# Patient Record
Sex: Female | Born: 1960 | Race: White | Hispanic: No | Marital: Married | Smoking: Never smoker
Health system: Southern US, Community
[De-identification: ages and names within clinical notes are randomized; demographics above are authoritative.]

## PROBLEM LIST (undated history)

## (undated) DIAGNOSIS — Z87442 Personal history of urinary calculi: Secondary | ICD-10-CM

## (undated) DIAGNOSIS — E039 Hypothyroidism, unspecified: Secondary | ICD-10-CM

## (undated) DIAGNOSIS — F419 Anxiety disorder, unspecified: Secondary | ICD-10-CM

## (undated) DIAGNOSIS — F32A Depression, unspecified: Secondary | ICD-10-CM

## (undated) DIAGNOSIS — K760 Fatty (change of) liver, not elsewhere classified: Secondary | ICD-10-CM

## (undated) DIAGNOSIS — F329 Major depressive disorder, single episode, unspecified: Secondary | ICD-10-CM

## (undated) DIAGNOSIS — E0500 Thyrotoxicosis with diffuse goiter without thyrotoxic crisis or storm: Secondary | ICD-10-CM

## (undated) HISTORY — DX: Major depressive disorder, single episode, unspecified: F32.9

## (undated) HISTORY — DX: Anxiety disorder, unspecified: F41.9

## (undated) HISTORY — PX: TUBAL LIGATION: SHX77

## (undated) HISTORY — DX: Thyrotoxicosis with diffuse goiter without thyrotoxic crisis or storm: E05.00

## (undated) HISTORY — DX: Depression, unspecified: F32.A

## (undated) HISTORY — PX: LITHOTRIPSY: SUR834

## (undated) HISTORY — DX: Hypothyroidism, unspecified: E03.9

## (undated) HISTORY — PX: BACK SURGERY: SHX140

---

## 1997-11-23 ENCOUNTER — Ambulatory Visit (HOSPITAL_COMMUNITY): Admission: RE | Admit: 1997-11-23 | Discharge: 1997-11-23 | Payer: Self-pay | Admitting: Family Medicine

## 1997-12-29 ENCOUNTER — Ambulatory Visit (HOSPITAL_COMMUNITY): Admission: RE | Admit: 1997-12-29 | Discharge: 1997-12-29 | Payer: Self-pay | Admitting: Family Medicine

## 1997-12-29 ENCOUNTER — Encounter: Payer: Self-pay | Admitting: Family Medicine

## 1998-06-15 ENCOUNTER — Other Ambulatory Visit: Admission: RE | Admit: 1998-06-15 | Discharge: 1998-06-15 | Payer: Self-pay | Admitting: Gynecology

## 1999-10-14 ENCOUNTER — Other Ambulatory Visit: Admission: RE | Admit: 1999-10-14 | Discharge: 1999-10-14 | Payer: Self-pay | Admitting: Gynecology

## 2001-01-24 ENCOUNTER — Encounter: Payer: Self-pay | Admitting: Urology

## 2001-01-24 ENCOUNTER — Ambulatory Visit (HOSPITAL_COMMUNITY): Admission: RE | Admit: 2001-01-24 | Discharge: 2001-01-24 | Payer: Self-pay | Admitting: Urology

## 2001-05-28 ENCOUNTER — Other Ambulatory Visit: Admission: RE | Admit: 2001-05-28 | Discharge: 2001-05-28 | Payer: Self-pay | Admitting: Gynecology

## 2002-06-19 ENCOUNTER — Other Ambulatory Visit: Admission: RE | Admit: 2002-06-19 | Discharge: 2002-06-19 | Payer: Self-pay | Admitting: Gynecology

## 2004-04-12 ENCOUNTER — Other Ambulatory Visit: Admission: RE | Admit: 2004-04-12 | Discharge: 2004-04-12 | Payer: Self-pay | Admitting: Gynecology

## 2005-04-19 ENCOUNTER — Other Ambulatory Visit: Admission: RE | Admit: 2005-04-19 | Discharge: 2005-04-19 | Payer: Self-pay | Admitting: Gynecology

## 2006-04-20 ENCOUNTER — Other Ambulatory Visit: Admission: RE | Admit: 2006-04-20 | Discharge: 2006-04-20 | Payer: Self-pay | Admitting: Gynecology

## 2007-07-15 ENCOUNTER — Other Ambulatory Visit: Admission: RE | Admit: 2007-07-15 | Discharge: 2007-07-15 | Payer: Self-pay | Admitting: Gynecology

## 2008-02-28 ENCOUNTER — Ambulatory Visit: Payer: Self-pay | Admitting: Obstetrics and Gynecology

## 2008-03-24 ENCOUNTER — Ambulatory Visit: Payer: Self-pay | Admitting: Gynecology

## 2008-05-28 ENCOUNTER — Ambulatory Visit: Payer: Self-pay | Admitting: Gynecology

## 2008-06-04 ENCOUNTER — Ambulatory Visit: Payer: Self-pay | Admitting: Gynecology

## 2008-06-05 ENCOUNTER — Encounter: Payer: Self-pay | Admitting: Gynecology

## 2008-06-05 ENCOUNTER — Ambulatory Visit: Payer: Self-pay | Admitting: Gynecology

## 2008-07-17 ENCOUNTER — Ambulatory Visit: Payer: Self-pay | Admitting: Gynecology

## 2008-09-15 ENCOUNTER — Encounter: Payer: Self-pay | Admitting: Gynecology

## 2008-09-15 ENCOUNTER — Ambulatory Visit: Payer: Self-pay | Admitting: Gynecology

## 2008-09-15 ENCOUNTER — Other Ambulatory Visit: Admission: RE | Admit: 2008-09-15 | Discharge: 2008-09-15 | Payer: Self-pay | Admitting: Gynecology

## 2009-02-01 ENCOUNTER — Ambulatory Visit: Payer: Self-pay | Admitting: Gynecology

## 2009-11-18 ENCOUNTER — Ambulatory Visit: Payer: Self-pay | Admitting: Gynecology

## 2009-11-18 ENCOUNTER — Other Ambulatory Visit: Admission: RE | Admit: 2009-11-18 | Discharge: 2009-11-18 | Payer: Self-pay | Admitting: Gynecology

## 2010-12-08 ENCOUNTER — Encounter: Payer: Self-pay | Admitting: *Deleted

## 2010-12-09 ENCOUNTER — Encounter: Payer: Self-pay | Admitting: Gynecology

## 2010-12-09 ENCOUNTER — Other Ambulatory Visit (HOSPITAL_COMMUNITY)
Admission: RE | Admit: 2010-12-09 | Discharge: 2010-12-09 | Disposition: A | Discharge status: Home / Self Care | Payer: 59 | Source: Ambulatory Visit | Attending: Gynecology | Admitting: Gynecology

## 2010-12-09 ENCOUNTER — Ambulatory Visit (INDEPENDENT_AMBULATORY_CARE_PROVIDER_SITE_OTHER): Payer: 59 | Admitting: Gynecology

## 2010-12-09 VITALS — BP 112/64 | Ht 65.0 in | Wt 200.0 lb

## 2010-12-09 DIAGNOSIS — Z131 Encounter for screening for diabetes mellitus: Secondary | ICD-10-CM

## 2010-12-09 DIAGNOSIS — Z01419 Encounter for gynecological examination (general) (routine) without abnormal findings: Secondary | ICD-10-CM

## 2010-12-09 DIAGNOSIS — R823 Hemoglobinuria: Secondary | ICD-10-CM

## 2010-12-09 DIAGNOSIS — E039 Hypothyroidism, unspecified: Secondary | ICD-10-CM

## 2010-12-09 DIAGNOSIS — N920 Excessive and frequent menstruation with regular cycle: Secondary | ICD-10-CM

## 2010-12-09 DIAGNOSIS — Z13220 Encounter for screening for lipoid disorders: Secondary | ICD-10-CM

## 2010-12-09 NOTE — Progress Notes (Signed)
Whitney Huynh [birth year] [id number]   History:    50 y.o.  for annual exam.  Continues to have heavy menses and wants to proceed with endometrial ablation. We've evaluated her before several years ago she had a negative sonohysterogram although her endometrial biopsy was inadequate she was supposed tohave this repeated she never did that.  Her periods last 7 days double protection with 2 episodes significant cramping.  Past medical history,surgical history, family history and social history were all reviewed and documented in the EPIC chart. ROS:  Was performed and pertinent positives and negatives are included in the history.  Exam: chaperone present Filed Vitals:   12/09/10 1043  BP: 112/64   General appearance  Normal Skin grossly normal Head/Neck normal with no cervical or supraclavicular adenopathy thyroid normal Lungs  clear Cardiac RR, without RMG Abdominal  soft, nontender, without masses, guarding, rebound or organomegaly  Breasts  examined lying and sitting without masses, retractions, discharge or axillary adenopathy. Pelvic  Ext/BUS/vagina  normal   Cervix  normal Pap done  Uterus  retroverted, normal size, shape and contour, midline and mobile nontender   Adnexa  Without masses or tenderness  Anus and perineum  normal   Rectovaginal  normal sphincter tone without palpated masses or tenderness.   Assessment/Plan:  50 y.o. female for annual exam .    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  Endometrial sac sampling was in adequate. Recommend a repeat sonohysterogram now with resampling as his been 2 and half years.  She wants to proceed with endometrial ablation we've discussed this before and we'll plan scheduling her for this. #2 Health maintenance: self breast exams was on a monthly basis discussed encouraged.  She is scheduling mammogram now. We'll check baseline labs  of CBC lipid profile glucose urinalysis and a TSH given her hypothyroid history and menorrhagia.   Dara Lords MD, 11:18 AM 12/09/2010

## 2010-12-13 ENCOUNTER — Telehealth: Payer: Self-pay | Admitting: *Deleted

## 2010-12-13 NOTE — Telephone Encounter (Signed)
PT CALLED WANTING TSH RESULT. LM ON PT VM THAT TSH STILL PENDING.

## 2010-12-14 ENCOUNTER — Telehealth: Payer: Self-pay | Admitting: *Deleted

## 2010-12-14 NOTE — Progress Notes (Signed)
Addended by: Dara Lords on: 12/14/2010 02:56 PM   Modules accepted: Orders

## 2010-12-14 NOTE — Telephone Encounter (Signed)
ok 

## 2010-12-14 NOTE — Telephone Encounter (Signed)
SPOKE WITH PT AND SHE SAID THAT SHE WILL GO TO HER ENDOCRINOLOGIST DR. BALAN TO GET THE FULL THYROID PANEL DRAWN. SHE WOULD LIKE ME TO FAX RESULTS OVER TO [facility]. PT WANTED ME TO INFORM YOU OF THIS.

## 2010-12-14 NOTE — Telephone Encounter (Signed)
Message copied by Aura Camps on Wed Dec 14, 2010  3:24 PM ------      Message from: Dara Lords      Created: Wed Dec 14, 2010  2:49 PM       Tell patient TSH is low may be consistent with hyper thyroid.  I want her to repeat a full thyroid panel we'll do this at [facility].

## 2011-01-05 DIAGNOSIS — F32A Depression, unspecified: Secondary | ICD-10-CM | POA: Insufficient documentation

## 2011-01-05 DIAGNOSIS — E0500 Thyrotoxicosis with diffuse goiter without thyrotoxic crisis or storm: Secondary | ICD-10-CM | POA: Insufficient documentation

## 2011-01-05 DIAGNOSIS — F329 Major depressive disorder, single episode, unspecified: Secondary | ICD-10-CM | POA: Insufficient documentation

## 2011-01-05 DIAGNOSIS — F419 Anxiety disorder, unspecified: Secondary | ICD-10-CM | POA: Insufficient documentation

## 2011-01-13 ENCOUNTER — Other Ambulatory Visit: Payer: 59

## 2011-01-13 ENCOUNTER — Ambulatory Visit: Payer: 59 | Admitting: Gynecology

## 2011-09-21 ENCOUNTER — Encounter: Payer: Self-pay | Admitting: Gynecology

## 2013-02-03 ENCOUNTER — Ambulatory Visit (INDEPENDENT_AMBULATORY_CARE_PROVIDER_SITE_OTHER): Payer: 59 | Admitting: Gynecology

## 2013-02-03 ENCOUNTER — Encounter: Payer: Self-pay | Admitting: Gynecology

## 2013-02-03 DIAGNOSIS — N926 Irregular menstruation, unspecified: Secondary | ICD-10-CM

## 2013-02-03 NOTE — Patient Instructions (Signed)
Follow up for ultrasound as scheduled 

## 2013-02-03 NOTE — Progress Notes (Signed)
Patient presents having not been seen for 2 years complaining of irregular menses. Notes that she will bleed on off and has been bleeding continuously now for months. She is on a low-dose oral contraceptive by her primary physician for several months now to try regulate her bleeding. Is being treated for hypothyroidism and said that she was checked 3 months ago and was normal. No significant weight changes, hair or skin changes. She is status post BTL in the past.  Exam was Kym Asst. Abdomen soft nontender without masses guarding rebound organomegaly. Pelvic external BUS vagina normal with dark blood staining. Cervix normal. Uterus retroverted globoid generous in size. Adnexa without masses or tenderness.   Assessment and plan : Irregular bleeding on BCPs. Check baseline CBC FSH TSH prolactin. Start with sonohysterogram to rule out nonpalpable abnormalities.

## 2013-02-04 LAB — CBC WITH DIFFERENTIAL/PLATELET
Eosinophils Absolute: 0.1 10*3/uL (ref 0.0–0.7)
Eosinophils Relative: 1 % (ref 0–5)
HCT: 38.6 % (ref 36.0–46.0)
Hemoglobin: 12.8 g/dL (ref 12.0–15.0)
Lymphocytes Relative: 31 % (ref 12–46)
Lymphs Abs: 1.8 10*3/uL (ref 0.7–4.0)
MCH: 30.5 pg (ref 26.0–34.0)
MCV: 92.1 fL (ref 78.0–100.0)
Monocytes Relative: 10 % (ref 3–12)
Platelets: 448 10*3/uL — ABNORMAL HIGH (ref 150–400)
RBC: 4.19 MIL/uL (ref 3.87–5.11)
WBC: 5.7 10*3/uL (ref 4.0–10.5)

## 2013-02-04 LAB — FOLLICLE STIMULATING HORMONE: FSH: 12.4 m[IU]/mL

## 2013-02-14 ENCOUNTER — Other Ambulatory Visit: Payer: Self-pay | Admitting: Gynecology

## 2013-02-14 ENCOUNTER — Encounter: Payer: Self-pay | Admitting: Gynecology

## 2013-02-14 DIAGNOSIS — N926 Irregular menstruation, unspecified: Secondary | ICD-10-CM

## 2013-02-21 ENCOUNTER — Ambulatory Visit (INDEPENDENT_AMBULATORY_CARE_PROVIDER_SITE_OTHER): Payer: 59

## 2013-02-21 ENCOUNTER — Telehealth: Payer: Self-pay

## 2013-02-21 ENCOUNTER — Encounter: Payer: Self-pay | Admitting: Gynecology

## 2013-02-21 ENCOUNTER — Ambulatory Visit (INDEPENDENT_AMBULATORY_CARE_PROVIDER_SITE_OTHER): Payer: 59 | Admitting: Gynecology

## 2013-02-21 DIAGNOSIS — N926 Irregular menstruation, unspecified: Secondary | ICD-10-CM

## 2013-02-21 DIAGNOSIS — N946 Dysmenorrhea, unspecified: Secondary | ICD-10-CM

## 2013-02-21 DIAGNOSIS — IMO0002 Reserved for concepts with insufficient information to code with codable children: Secondary | ICD-10-CM

## 2013-02-21 DIAGNOSIS — D259 Leiomyoma of uterus, unspecified: Secondary | ICD-10-CM

## 2013-02-21 NOTE — Patient Instructions (Signed)
Office will call you to arrange surgery. 

## 2013-02-21 NOTE — Progress Notes (Signed)
Patient presents for sonohistogram with months of irregular bleeding. Was placed on oral contraceptives but continued to have bleeding. Laboratory testing to include FSH TSH prolactin were normal.  Ultrasound shows normal uterine size. 2 small myomas largest 17 mm . The endometrial echo 4.7 mm. Right and left ovaries normal. Cul-de-sac negative.  Sonohysterogram attempted under sterile technique and due to the severe retroflexion retroversion I was unable to negotiate into the endometrial cavity and there was some distention with no abnormality seen but suboptimal. No sample taken.  Assessment and plan: Irregular bleeding 52 year old female refractile to birth control pills with suboptimal sonohysterogram. Options for management reviewed and ultimately we decided to proceed with hysteroscopy D&C to clear the endometrial cavity and possible therapeutic effect of the D&C. I reviewed with involved with the procedure to include the expected intraoperative postoperative courses as well as the recovery period. I reviewed the use of the hysteroscope and D&C portion. Risks of infection, hemorrhage necessitating transfusion and the risk of transfusion including transfusion reaction hepatitis HIV mad cow disease and other unknown entities. Uterine perforation with damage to internal organs including bowel bladder ureters vessels and nerves necessitating major exploratory reparative surgeries and future repair and surgeries including ostomy formation bowel resection and bladder repair ureteral damage repair was all discussed with her. Distended media Bjrk and leading to metabolic complications such as coma and seizures was also discussed. No guarantees as far resolving the irregular bleeding also discussed. We will go ahead and schedule this at her convenience.  I went ahead and reperformed a full exam in the event surgery is scheduled soon to include: HEENT normal Lungs clear Cardiac regular rate no rubs murmurs  or gallops Abdomen soft nontender without masses or guarding Pelvic external BUS vagina normal. Cervix anterior grossly normal. Uterus significantly retroflexed normal size midline mobile. Adnexa without masses.

## 2013-02-21 NOTE — Telephone Encounter (Signed)
I called patient to schedule surgery and first discussed her insurance benefits with her $1000 deductible not yet met 80/20.  Her surgery prepymt will be $500 to [facility].  She told me that she is not working right now and she is going to have to put this off and not schedule now.  She said she will call me to schedule when she gets to a place where she can afford to do it.

## 2013-03-04 ENCOUNTER — Encounter: Payer: Self-pay | Admitting: Gynecology

## 2013-03-04 ENCOUNTER — Ambulatory Visit (INDEPENDENT_AMBULATORY_CARE_PROVIDER_SITE_OTHER): Payer: 59 | Admitting: Gynecology

## 2013-03-04 VITALS — BP 118/74 | Ht 65.0 in | Wt 239.0 lb

## 2013-03-04 DIAGNOSIS — Z01419 Encounter for gynecological examination (general) (routine) without abnormal findings: Secondary | ICD-10-CM

## 2013-03-04 NOTE — Progress Notes (Signed)
Whitney Huynh February 17, 1961 [id number]        52 y.o.  G3P3003 for annual exam.  Several issues noted below.  Past medical history,surgical history, medications, allergies, family history and social history were all reviewed and documented in the EPIC chart.  ROS:  Performed and pertinent positives and negatives are included in the history, assessment and plan .  Exam: Kim assistant Filed Vitals:   03/04/13 1444  BP: 118/74  Height: 5\' 5"  (1.651 m)  Weight: 239 lb (108.41 kg)   General appearance  Normal Skin grossly normal Head/Neck normal with no cervical or supraclavicular adenopathy thyroid normal Lungs  clear Cardiac RR, without RMG Abdominal  soft, nontender, without masses, organomegaly or hernia Breasts  examined lying and sitting without masses, retractions, discharge or axillary adenopathy. Pelvic  Ext/BUS/vagina  normal  Cervix  normal anterior  Uterus  retroverted, normal size, shape and contour, midline and mobile nontender   Adnexa  Without masses or tenderness    Anus and perineum  normal   Rectovaginal  normal sphincter tone without palpated masses or tenderness.    Assessment/Plan:  52 y.o. G47P3003 female for annual exam.   1. History of irregular menses. Recent sonohysterogram suboptimal in no endometrial sample taken. CBC FSH prolactin TSH were normal. Was to schedule hysteroscopy D&C for endometrial clearance but has canceled this due to financial reasons. I reviewed with her I cannot guarantee there is not endometrial pathology and my recommendation is that she proceed with endometrial sampling. The patient understands our recommendation and disclaimers but does not want to schedule at this time accepting the risks. Patient will keep menstrual calendar and if irregular bleeding continues she'll represent for further evaluation again recognizes my recommendation to proceed with hysteroscopy D&C at this point. 2. Pap smear 2012. No Pap smear done today. No history of  abnormal Pap smears previously. Repeat Pap smear next year at 3 year interval. 3. Mammography overdue. Patient knows to schedule and agrees to do so. SBE monthly reviewed. 4. Colonoscopy never. Recommended she scheduled this coming year as she has turned 52. Patient agrees to do so. 5. DEXA never. We'll plan further into the menopause. Increase calcium vitamin D reviewed. 6. Health maintenance. Baseline comprehensive metabolic panel lipid profile and urinalysis ordered. She has already had recent CBC and thyroid with her other hormone studies.  Note: This document was prepared with digital dictation and possible smart phrase technology. Any transcriptional errors that result from this process are unintentional.   Dara Lords MD, 3:26 PM 03/04/2013

## 2013-03-04 NOTE — Patient Instructions (Addendum)
Schedule colonoscopy with [facility] at [phone] or [facility] at [phone]  Call to Schedule your mammogram  Facilities in [place]: 1)  [facility], [address] Phone: [phone] 2)  [facility], [address] Phone: [phone] 3)  Dr. Bertrand at [facility]  [address] Phone: [phone]     Mammogram A mammogram is an X-ray test to find changes in a woman's breast. You should get a mammogram if:  You are 40 years of age or older  You have risk factors.   Your doctor recommends that you have one.  BEFORE THE TEST  Do not schedule the test the week before your period, especially if your breasts are sore during this time.  On the day of your mammogram:  Wash your breasts and armpits well. After washing, do not put on any deodorant or talcum powder on until after your test.   Eat and drink as you usually do.   Take your medicines as usual.   If you are diabetic and take insulin, make sure you:   Eat before coming for your test.   Take your insulin as usual.   If you cannot keep your appointment, call before the appointment to cancel. Schedule another appointment.  TEST  You will need to undress from the waist up. You will put on a hospital gown.   Your breast will be put on the mammogram machine, and it will press firmly on your breast with a piece of plastic called a compression paddle. This will make your breast flatter so that the machine can X-ray all parts of your breast.   Both breasts will be X-rayed. Each breast will be X-rayed from above and from the side. An X-ray might need to be taken again if the picture is not good enough.   The mammogram will last about 15 to 30 minutes.  AFTER THE TEST Finding out the results of your test Ask when your test results will be ready. Make sure you get your test  results.  Document Released: 07/21/2008 Document Revised: 04/13/2011 Document Reviewed: 07/21/2008 [employer] Patient Information 2012 [employer].   

## 2013-03-05 LAB — LIPID PANEL
Cholesterol: 150 mg/dL (ref 0–200)
Total CHOL/HDL Ratio: 3.6 Ratio
Triglycerides: 180 mg/dL — ABNORMAL HIGH (ref <150)
VLDL: 36 mg/dL (ref 0–40)

## 2013-03-05 LAB — COMPREHENSIVE METABOLIC PANEL
ALT: 19 U/L (ref 0–35)
Alkaline Phosphatase: 50 U/L (ref 39–117)
CO2: 22 mEq/L (ref 19–32)
Calcium: 9.4 mg/dL (ref 8.4–10.5)
Chloride: 105 mEq/L (ref 96–112)
Creat: 0.93 mg/dL (ref 0.50–1.10)
Glucose, Bld: 86 mg/dL (ref 70–99)
Total Bilirubin: 0.2 mg/dL — ABNORMAL LOW (ref 0.3–1.2)
Total Protein: 6.9 g/dL (ref 6.0–8.3)

## 2013-03-05 LAB — URINALYSIS W MICROSCOPIC + REFLEX CULTURE
Bilirubin Urine: NEGATIVE
Crystals: NONE SEEN
Ketones, ur: NEGATIVE mg/dL
Protein, ur: NEGATIVE mg/dL
Specific Gravity, Urine: 1.022 (ref 1.005–1.030)
Urobilinogen, UA: 0.2 mg/dL (ref 0.0–1.0)

## 2013-03-11 ENCOUNTER — Encounter: Payer: Self-pay | Admitting: Gynecology

## 2013-03-13 ENCOUNTER — Other Ambulatory Visit: Payer: Self-pay

## 2014-03-09 ENCOUNTER — Encounter: Payer: Self-pay | Admitting: Gynecology

## 2018-02-18 ENCOUNTER — Ambulatory Visit: Payer: Self-pay | Admitting: Physician Assistant

## 2018-02-19 ENCOUNTER — Encounter: Payer: Self-pay | Admitting: Gynecology

## 2018-02-19 ENCOUNTER — Ambulatory Visit: Payer: 59 | Admitting: Gynecology

## 2018-02-19 VITALS — BP 118/78 | Ht 65.0 in | Wt 261.0 lb

## 2018-02-19 DIAGNOSIS — Z01419 Encounter for gynecological examination (general) (routine) without abnormal findings: Secondary | ICD-10-CM

## 2018-02-19 DIAGNOSIS — N951 Menopausal and female climacteric states: Secondary | ICD-10-CM | POA: Diagnosis not present

## 2018-02-19 NOTE — Progress Notes (Signed)
    Whitney Huynh Dec 09, 1960 [id number]        57 y.o.  G3P3003 for annual gynecologic exam.  Has not been in the office since 2014.  Has been seeing another practice.  Last reported Pap smear 2018 which was normal by her history.  Without gynecologic complaints.  She is having some mild menopausal symptoms.  No bleeding since 2014.  Past medical history,surgical history, problem list, medications, allergies, family history and social history were all reviewed and documented as reviewed in the EPIC chart.  ROS:  Performed with pertinent positives and negatives included in the history, assessment and plan.   Additional significant findings : None   Exam: Caryn Bee assistant Vitals:   02/19/18 1410  BP: 118/78  Weight: 261 lb (118.4 kg)  Height: 5\' 5"  (1.651 m)   Body mass index is 43.43 kg/m.  General appearance:  Normal affect, orientation and appearance. Skin: Grossly normal HEENT: Without gross lesions.  No cervical or supraclavicular adenopathy. Thyroid normal.  Lungs:  Clear without wheezing, rales or rhonchi Cardiac: RR, without RMG Abdominal:  Soft, nontender, without masses, guarding, rebound, organomegaly or hernia Breasts:  Examined lying and sitting without masses, retractions, discharge or axillary adenopathy. Pelvic:  Ext, BUS, Vagina: Normal with mild atrophic changes  Cervix: Normal with mild atrophic changes  Uterus: Difficult to palpate but grossly normal midline mobile nontender  Adnexa: Without gross masses or tenderness    Anus and perineum: Normal   Rectovaginal: Normal sphincter tone without palpated masses or tenderness.    Assessment/Plan:  57 y.o. G28P3003 female for annual gynecologic exam.   1. Postmenopausal/menopausal symptoms.  Having some mild symptoms attributable to menopause.  We reviewed the issues of HRT, risks versus benefits.  At this point symptoms do not appear to warrant treatment with HRT.  Will monitor and follow-up if symptoms would  worsen and wants to do rediscuss HRT.  No vaginal bleeding. 2. Pap smear reported normal 2018.  No Pap smear done today.  No history of abnormal Pap smears.  Plan repeat Pap smear at 3-year interval per current screening guidelines. 3. Colonoscopy never.  Has discussed colon screening with her primary physician and and is planning on Cologuard.  She will continue to follow-up with her primary physician for colon screening. 4. Mammography due now and patient is going to call and schedule.  Names and numbers provided.  Breast exam normal today. 5. DEXA never.  Will plan further into the menopause. 6. Health maintenance.  No routine lab work done as patient does this at her primary physician's office.  Follow-up 1 year, sooner as needed.   Anastasio Auerbach MD, 2:35 PM 02/19/2018

## 2018-02-19 NOTE — Patient Instructions (Signed)
Follow-up in 1 year for your annual exam.   Call to Schedule your mammogram  Facilities in [place]: 1)  [facility] [address] [place] Phone: [phone] 2)  Dr. Isaiah Blakes at [facility] [address] Phone: [phone]     Mammogram A mammogram is an X-ray test to find changes in a woman's breast. You should get a mammogram if:  You are 55 years of age or older  You have risk factors.   Your doctor recommends that you have one.  BEFORE THE TEST  Do not schedule the test the week before your period, especially if your breasts are sore during this time.  On the day of your mammogram:  Wash your breasts and armpits well. After washing, do not put on any deodorant or talcum powder on until after your test.   Eat and drink as you usually do.   Take your medicines as usual.   If you are diabetic and take insulin, make sure you:   Eat before coming for your test.   Take your insulin as usual.   If you cannot keep your appointment, call before the appointment to cancel. Schedule another appointment.  TEST  You will need to undress from the waist up. You will put on a hospital gown.   Your breast will be put on the mammogram machine, and it will press firmly on your breast with a piece of plastic called a compression paddle. This will make your breast flatter so that the machine can X-ray all parts of your breast.   Both breasts will be X-rayed. Each breast will be X-rayed from above and from the side. An X-ray might need to be taken again if the picture is not good enough.   The mammogram will last about 15 to 30 minutes.  AFTER THE TEST Finding out the results of your test Ask when your test results will be ready. Make sure you get your test results.  Document Released: 07/21/2008 Document Revised: 04/13/2011 Document Reviewed: 07/21/2008 [facility] Patient Information 2012 [place].

## 2018-03-14 ENCOUNTER — Other Ambulatory Visit: Payer: Self-pay | Admitting: Gynecology

## 2018-03-14 DIAGNOSIS — Z1231 Encounter for screening mammogram for malignant neoplasm of breast: Secondary | ICD-10-CM

## 2018-03-21 ENCOUNTER — Encounter: Payer: Self-pay | Admitting: Physician Assistant

## 2018-03-21 ENCOUNTER — Ambulatory Visit: Payer: 59 | Admitting: Physician Assistant

## 2018-03-21 DIAGNOSIS — G4700 Insomnia, unspecified: Secondary | ICD-10-CM

## 2018-03-21 DIAGNOSIS — F411 Generalized anxiety disorder: Secondary | ICD-10-CM | POA: Diagnosis not present

## 2018-03-21 DIAGNOSIS — F331 Major depressive disorder, recurrent, moderate: Secondary | ICD-10-CM

## 2018-03-21 NOTE — Patient Instructions (Signed)
Instead of taking 2 Abilify, take 1-1/2 to equal 3 mg.

## 2018-03-21 NOTE — Progress Notes (Signed)
[facility]  Patient ID: Whitney Huynh,  MRN: [id number]  PCP: Patient, No Pcp Per  Date of Evaluation: 03/21/2018 Time spent:15 minutes  Chief Complaint:  Chief Complaint    Follow-up      HISTORY/CURRENT STATUS: HPI here for 6-week med check.  At the last visit we increased the Abilify to help with motivation and energy.  It has really helped a lot, with that as well as her mood.  The only problem she is having is more ruminating thoughts.  They are worse in the evening and are usually triggered by something.  When she takes an Ativan, it helps.  Patient denies loss of interest in usual activities and is able to enjoy things.  Denies decreased energy or motivation.  Appetite has not changed.  No extreme sadness, tearfulness, or feelings of hopelessness.  Denies any changes in concentration, making decisions or remembering things.  Denies suicidal or homicidal thoughts.  She is sleeping well.  Occasionally, she wakes up around 3:00 but has no problems going back to sleep.  Work is going well.  Individual Medical History/ Review of Systems: Changes? :No    Past medications for mental health diagnoses include: Zoloft, Prozac, Lexapro, Wellbutrin,, Ativan, Abilify  Allergies: Morphine and related  Current Medications:  Current Outpatient Medications:  .  buPROPion (WELLBUTRIN XL) 300 MG 24 hr tablet, Take 300 mg by mouth daily., Disp: , Rfl:  .  ARIPiprazole (ABILIFY) 2 MG tablet, Take 4 mg by mouth daily. , Disp: , Rfl:  .  levothyroxine (SYNTHROID) 150 MCG tablet, Take 175 mcg by mouth daily. , Disp: , Rfl:  .  LORazepam (ATIVAN) 1 MG tablet, Take 0.5 mg by mouth 2 (two) times daily as needed. , Disp: , Rfl:  .  Vilazodone HCl (VIIBRYD) 40 MG TABS, Take by mouth daily.  , Disp: , Rfl:  Medication Side Effects: none  Family Medical/ Social History: Changes? No  MENTAL HEALTH EXAM:  Last menstrual period 02/17/2013.There is no height or weight on file to  calculate BMI.  General Appearance: Well Groomed and Obese  Eye Contact:  Good  Speech:  Clear and Coherent  Volume:  Normal  Mood:  Euthymic  Affect:  Appropriate  Thought Process:  Goal Directed  Orientation:  Full (Time, Place, and Person)  Thought Content: Logical   Suicidal Thoughts:  No  Homicidal Thoughts:  No  Memory:  WNL  Judgement:  Good  Insight:  Good  Psychomotor Activity:  Normal  Concentration:  Concentration: Good  Recall:  Good  Fund of Knowledge: Good  Language: Good  Assets:  Desire for Improvement  ADL's:  Intact  Cognition: WNL  Prognosis:  Good    DIAGNOSES:    ICD-10-CM   1. Major depressive disorder, recurrent episode, moderate (HCC) F33.1   2. Insomnia, unspecified type G47.00   3. Generalized anxiety disorder F41.1     Receiving Psychotherapy: No    RECOMMENDATIONS: Because the ruminations started around the same time that we increased the Abilify that can be more activating, will decrease the Abilify from 4 mg to 3 mg.  The Abilify tablets are white small so she may end up using the 2 mg in biting and another one in half but that will be fine.  If the motivation and energy decreased then she can go back up to 4 mg.  She verbalizes understanding. Continue all other medications. Return in 3 months or sooner as needed.   Donnal Moat, PA-C

## 2018-04-24 ENCOUNTER — Ambulatory Visit
Admission: RE | Admit: 2018-04-24 | Discharge: 2018-04-24 | Disposition: A | Discharge status: Home / Self Care | Payer: 59 | Source: Ambulatory Visit | Attending: Gynecology | Admitting: Gynecology

## 2018-04-24 DIAGNOSIS — Z1231 Encounter for screening mammogram for malignant neoplasm of breast: Secondary | ICD-10-CM

## 2018-04-24 IMAGING — MG DIGITAL SCREENING BILATERAL MAMMOGRAM WITH TOMO AND CAD
8 series · 8 of 24 positions shown · non-contrast
Comparison: Previous exam(s).

CLINICAL DATA: Screening.

EXAM:
DIGITAL SCREENING BILATERAL MAMMOGRAM WITH TOMO AND CAD

[L MLO synth-2D]
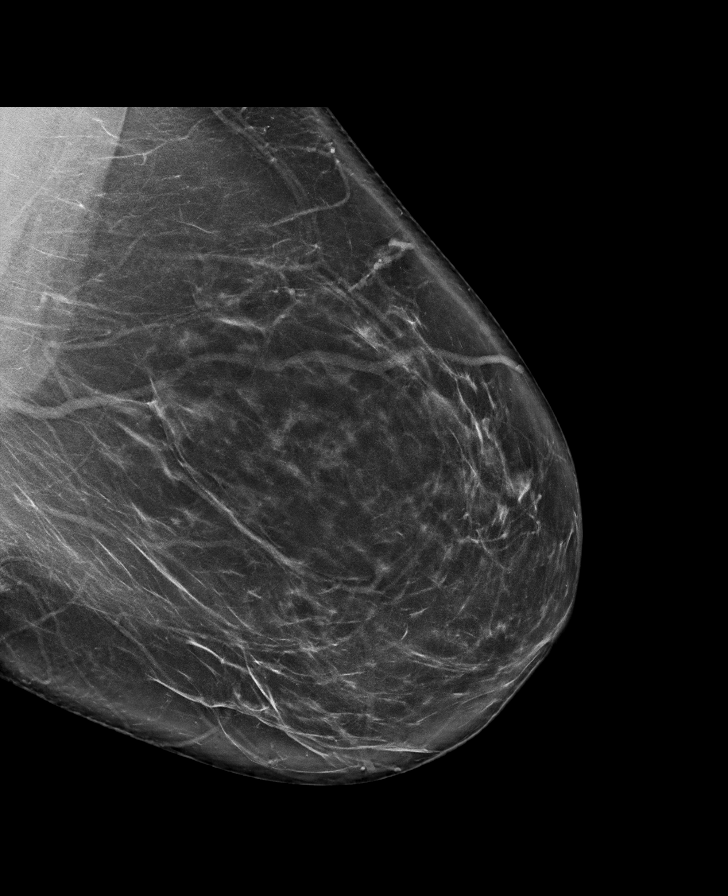

[L CC synth-2D]
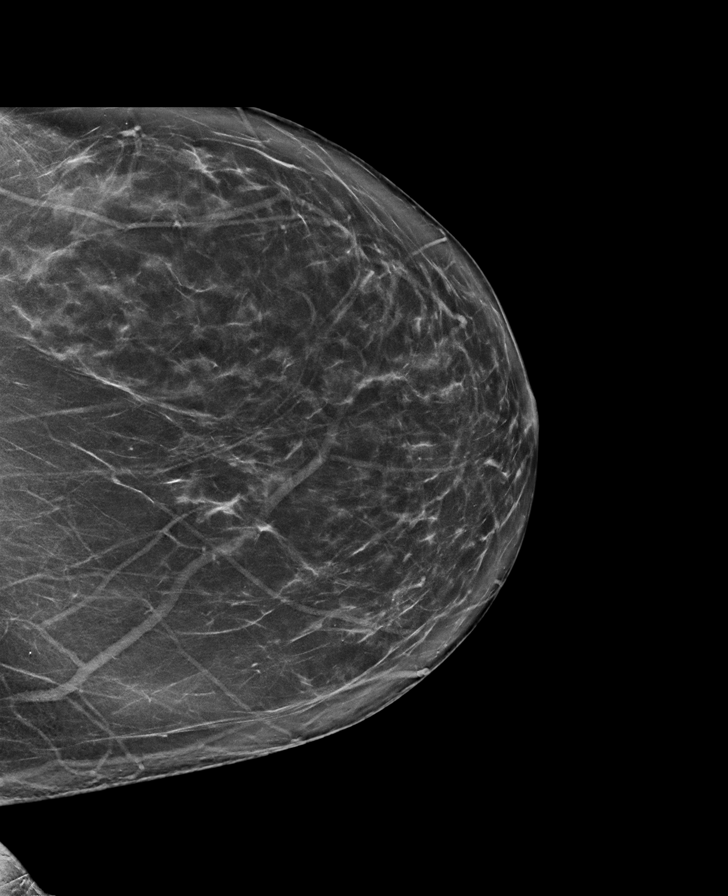

[R CC synth-2D]
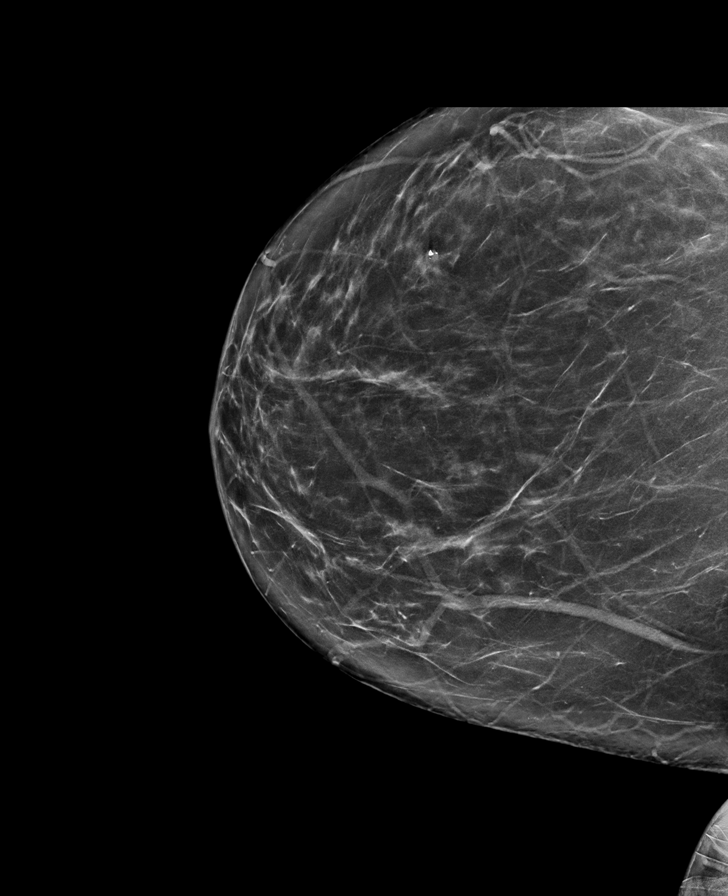

[R MLO synth-2D]
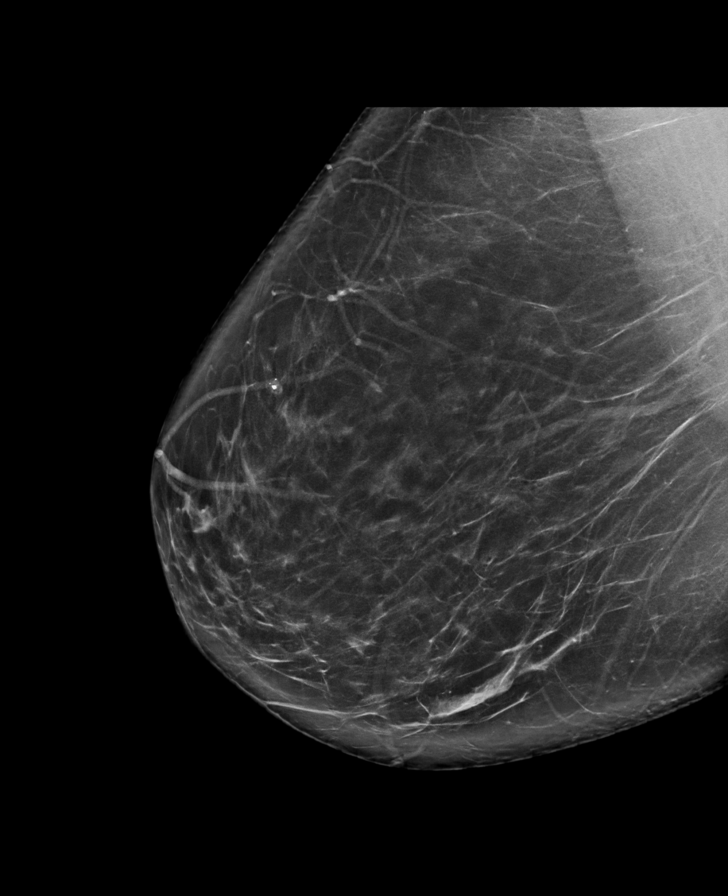

[L MLO tomo · tomo slice 47/94.0]
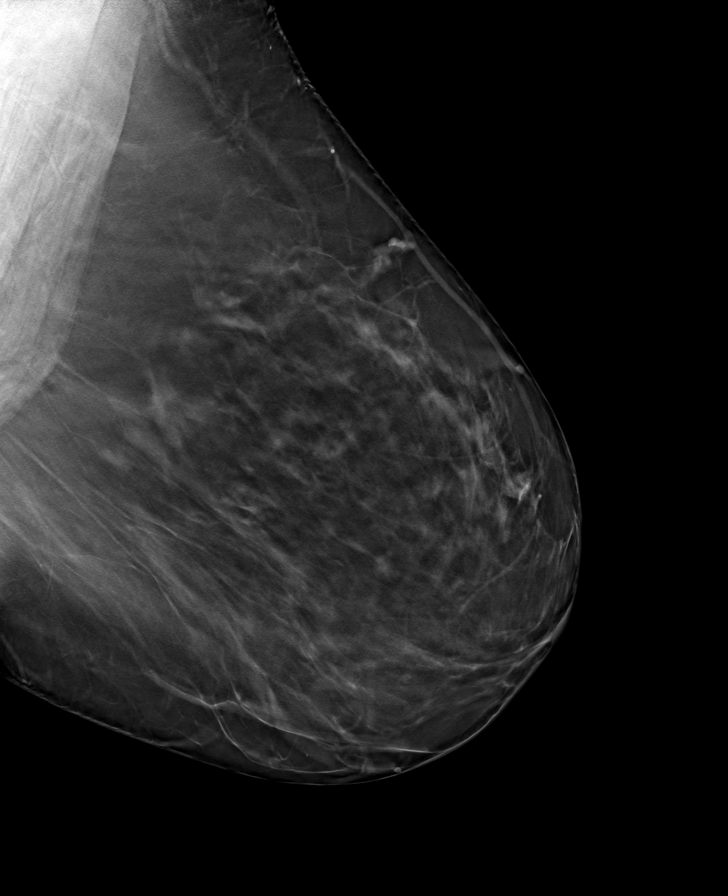

[R MLO tomo · tomo slice 48/95.0]
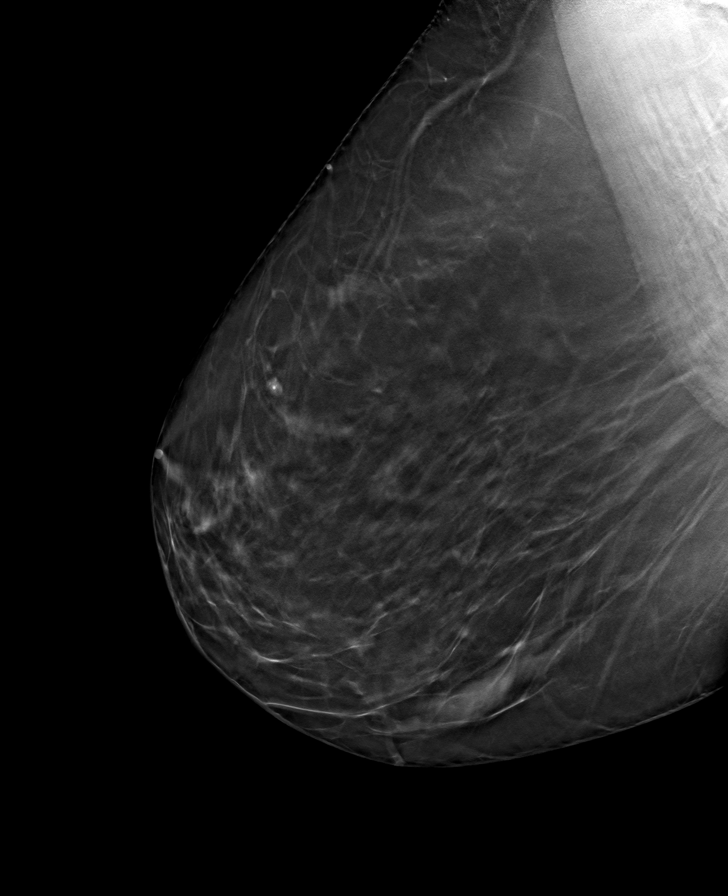

[R CC tomo · tomo slice 41/81.0]
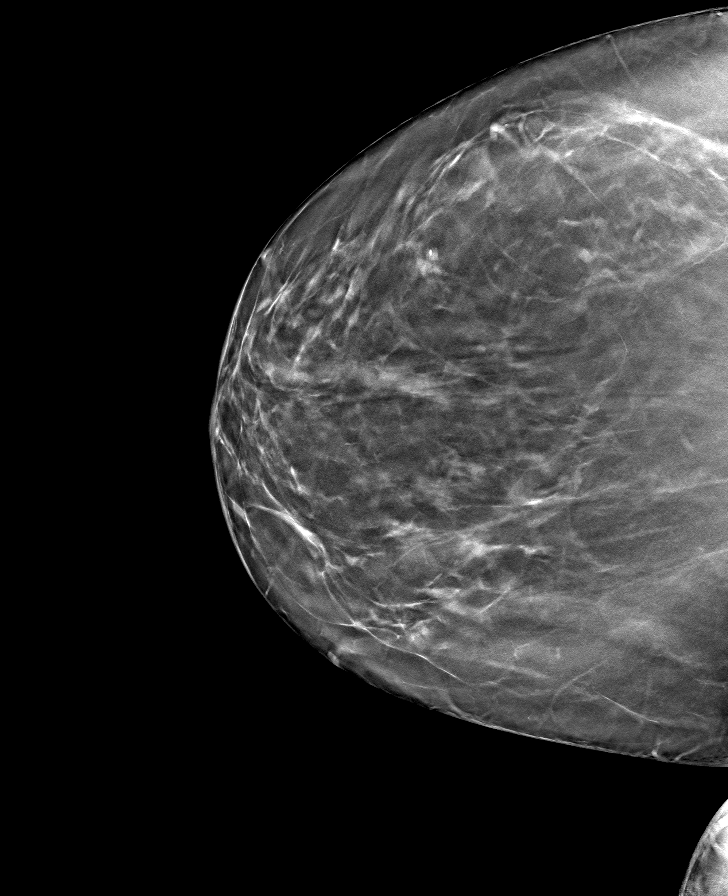

[L CC tomo · tomo slice 41/80.0]
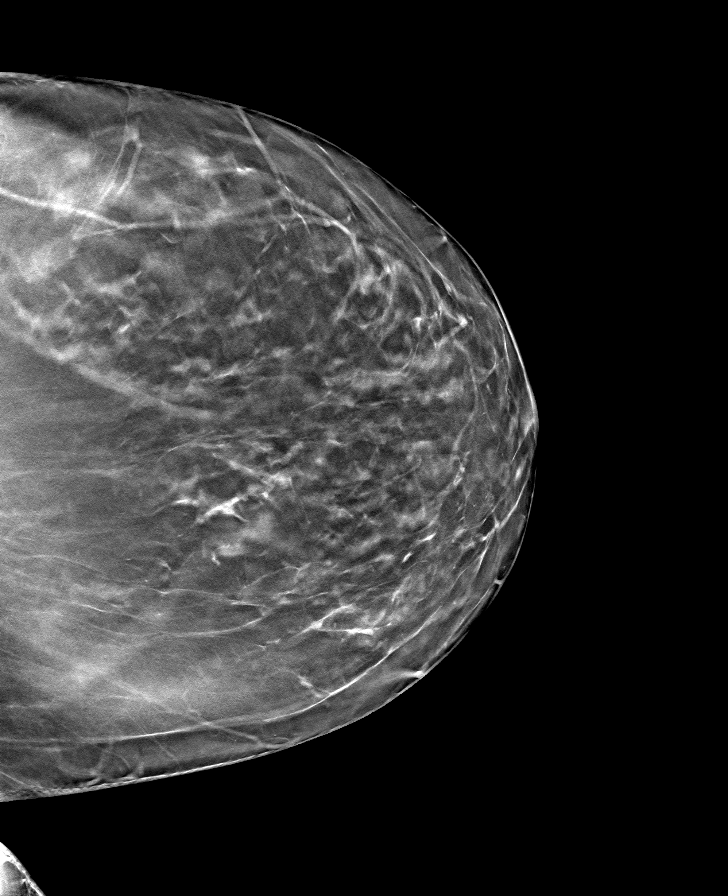

[8 of 24 positions shown; findings below may reference images not displayed]

ACR Breast Density Category b: There are scattered areas of
fibroglandular density.
FINDINGS: There are no findings suspicious for malignancy. Images were
processed with CAD.
IMPRESSION: No mammographic evidence of malignancy. A result letter of this
screening mammogram will be mailed directly to the patient.

RECOMMENDATION:
Screening mammogram in one year. (Code:[TQ])

BI-RADS CATEGORY  1: Negative.

## 2018-05-22 ENCOUNTER — Other Ambulatory Visit: Payer: Self-pay | Admitting: Physician Assistant

## 2018-06-11 ENCOUNTER — Encounter: Payer: Self-pay | Admitting: Emergency Medicine

## 2018-06-11 DIAGNOSIS — G4700 Insomnia, unspecified: Secondary | ICD-10-CM

## 2018-06-11 DIAGNOSIS — R635 Abnormal weight gain: Secondary | ICD-10-CM | POA: Insufficient documentation

## 2018-06-18 ENCOUNTER — Ambulatory Visit: Payer: 59 | Admitting: Physician Assistant

## 2018-06-18 ENCOUNTER — Encounter: Payer: Self-pay | Admitting: Physician Assistant

## 2018-06-18 DIAGNOSIS — F331 Major depressive disorder, recurrent, moderate: Secondary | ICD-10-CM

## 2018-06-18 DIAGNOSIS — F411 Generalized anxiety disorder: Secondary | ICD-10-CM

## 2018-06-18 DIAGNOSIS — G4700 Insomnia, unspecified: Secondary | ICD-10-CM

## 2018-06-18 MED ORDER — ARIPIPRAZOLE 2 MG PO TABS: 3.0000 mg | ORAL_TABLET | Freq: Every day | ORAL | 5 refills | Status: DC

## 2018-06-18 MED ORDER — BUPROPION HCL ER (XL) 300 MG PO TB24: 300.0000 mg | ORAL_TABLET | Freq: Every day | ORAL | 5 refills | Status: DC

## 2018-06-18 MED ORDER — VILAZODONE HCL 40 MG PO TABS: 40.0000 mg | ORAL_TABLET | Freq: Every day | ORAL | 5 refills | Status: DC

## 2018-06-18 MED ORDER — LORAZEPAM 1 MG PO TABS: 0.5000 mg | ORAL_TABLET | Freq: Two times a day (BID) | ORAL | 5 refills | Status: DC | PRN

## 2018-06-18 NOTE — Progress Notes (Signed)
[facility]  Patient ID: Whitney Huynh,  MRN: [id number]  PCP: Patient, No Pcp Per  Date of Evaluation: 06/18/2018 Time spent:15 minutes  Chief Complaint:  Chief Complaint    Follow-up      HISTORY/CURRENT STATUS: HPI Here for routine med check.  Patient denies loss of interest in usual activities and is able to enjoy things.  Denies decreased energy or motivation.  Appetite has not changed.  No extreme sadness, tearfulness, or feelings of hopelessness.  Denies any changes in concentration, making decisions or remembering things.  Denies suicidal or homicidal thoughts.  Anxiety is well controlled overall.  She sleeps well as long as she takes the Ativan.  Individual Medical History/ Review of Systems: Changes? :No    Past medications for mental health diagnoses include: Zoloft, Prozac, Lexapro, Wellbutrin, Viibryd, Cymbalta, Ativan, Abilify  Allergies: Morphine and related  Current Medications:  Current Outpatient Medications:  .  ARIPiprazole (ABILIFY) 2 MG tablet, Take 1.5 tablets (3 mg total) by mouth daily., Disp: 60 tablet, Rfl: 5 .  buPROPion (WELLBUTRIN XL) 300 MG 24 hr tablet, Take 1 tablet (300 mg total) by mouth daily., Disp: 30 tablet, Rfl: 5 .  levothyroxine (SYNTHROID) 150 MCG tablet, Take 175 mcg by mouth daily. , Disp: , Rfl:  .  LORazepam (ATIVAN) 1 MG tablet, Take 0.5-1 tablets (0.5-1 mg total) by mouth 2 (two) times daily as needed., Disp: 60 tablet, Rfl: 5 .  Vilazodone HCl (VIIBRYD) 40 MG TABS, Take 1 tablet (40 mg total) by mouth daily., Disp: 30 tablet, Rfl: 5 Medication Side Effects: none  Family Medical/ Social History: Changes? Yes dtr's boyfriend broke up w/ her and it's been hard on the whole fm.  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  MENTAL HEALTH EXAM:   Last menstrual period 02/17/2013.There is no height or weight on file to calculate BMI.  General Appearance: Casual and Well Groomed  Eye Contact:  Good  Speech:   Clear and Coherent  Volume:  Normal  Mood:  Euthymic  Affect:  Appropriate  Thought Process:  Goal Directed  Orientation:  Full (Time, Place, and Person)  Thought Content: Logical   Suicidal Thoughts:  No  Homicidal Thoughts:  No  Memory:  WNL  Judgement:  Good  Insight:  Good  Psychomotor Activity:  Normal  Concentration:  Concentration: Good  Recall:  Good  Fund of Knowledge: Good  Language: Good  Assets:  Desire for Improvement  ADL's:  Intact  Cognition: WNL  Prognosis:  Good    DIAGNOSES:    ICD-10-CM   1. Major depressive disorder, recurrent episode, moderate (HCC) F33.1   2. Insomnia, unspecified type G47.00   3. Generalized anxiety disorder F41.1     Receiving Psychotherapy: No    RECOMMENDATION: Continue Abilify 3 mg daily. Continue Wellbutrin XL 300 mg OD. Continue Ativan 1 mg half to 1 twice daily as needed. Continue Viibryd 40 mg daily. Return in 6 months or sooner as needed.   Donnal Moat, PA-C

## 2018-06-21 ENCOUNTER — Other Ambulatory Visit: Payer: Self-pay | Admitting: Physician Assistant

## 2018-12-16 ENCOUNTER — Other Ambulatory Visit: Payer: Self-pay | Admitting: Physician Assistant

## 2018-12-17 ENCOUNTER — Other Ambulatory Visit: Payer: Self-pay

## 2018-12-17 ENCOUNTER — Ambulatory Visit (INDEPENDENT_AMBULATORY_CARE_PROVIDER_SITE_OTHER): Payer: 59 | Admitting: Physician Assistant

## 2018-12-17 ENCOUNTER — Encounter: Payer: Self-pay | Admitting: Physician Assistant

## 2018-12-17 DIAGNOSIS — G4700 Insomnia, unspecified: Secondary | ICD-10-CM

## 2018-12-17 DIAGNOSIS — F331 Major depressive disorder, recurrent, moderate: Secondary | ICD-10-CM | POA: Diagnosis not present

## 2018-12-17 DIAGNOSIS — F411 Generalized anxiety disorder: Secondary | ICD-10-CM | POA: Diagnosis not present

## 2018-12-17 MED ORDER — BUPROPION HCL ER (XL) 300 MG PO TB24: 300.0000 mg | ORAL_TABLET | Freq: Every day | ORAL | 5 refills | Status: DC

## 2018-12-17 MED ORDER — ARIPIPRAZOLE 2 MG PO TABS: 3.0000 mg | ORAL_TABLET | Freq: Once | ORAL | 5 refills | Status: DC

## 2018-12-17 MED ORDER — LORAZEPAM 1 MG PO TABS: 0.5000 mg | ORAL_TABLET | Freq: Three times a day (TID) | ORAL | 5 refills | Status: DC | PRN

## 2018-12-17 MED ORDER — VIIBRYD 40 MG PO TABS: 40.0000 mg | ORAL_TABLET | Freq: Every day | ORAL | 5 refills | Status: DC

## 2018-12-17 NOTE — Progress Notes (Signed)
[facility]  Patient ID: Whitney Huynh,  MRN: [id number]  PCP: Patient, No Pcp Per  Date of Evaluation: 12/17/2018 Time spent:15 minutes  Chief Complaint:  Chief Complaint    Depression; Anxiety; Follow-up     Virtual Visit via Telephone Note  I connected with patient by a video enabled telemedicine application or telephone, with their informed consent, and verified patient privacy and that I am speaking with the correct person using two identifiers.  I am private, in my home and the patient is home.   I discussed the limitations, risks, security and privacy concerns of performing an evaluation and management service by telephone and the availability of in person appointments. I also discussed with the patient that there may be a patient responsible charge related to this service. The patient expressed understanding and agreed to proceed.   I discussed the assessment and treatment plan with the patient. The patient was provided an opportunity to ask questions and all were answered. The patient agreed with the plan and demonstrated an understanding of the instructions.   The patient was advised to call back or seek an in-person evaluation if the symptoms worsen or if the condition fails to improve as anticipated.  I provided 15 minutes of non-face-to-face time during this encounter.  HISTORY/CURRENT STATUS: HPI For routine med check.  Her Dad has been dx w/ terminal bladder cancer.  He only has a few months to live.  Mom's dementia is getting worse.  Patient states she is overwhelmed but is making it through.  Her biggest problem right now is trying to get long-term care to go into her parents house.  Apparently there is a shortage of assistance in that area.  Patient is continuing to work.  States that her employer has been extremely understanding due to all the circumstances.  Patient states she sleeps pretty well.  The anxiety has worsened with her situation.  The  Ativan does not seem to be working as well and it does not last as long.  She wonders what we can do to help.  She does not want to be "numb"  through this whole situation, but she does need help with the anxiety so that she can function.  She is of course very sad due to her parents illnesses.  Energy and motivation are fair to good.  Denies suicidal or homicidal thoughts.  She does cry easily but knows it is related to grief.  Denies dizziness, syncope, seizures, numbness, tingling, tremor, tics, unsteady gait, slurred speech, confusion. Denies muscle or joint pain, stiffness, or dystonia.  Individual Medical History/ Review of Systems: Changes? :No    Past medications for mental health diagnoses include: Zoloft, Prozac, Lexapro, Wellbutrin, Viibryd, Cymbalta, Ativan, Abilify  Allergies: Morphine and related  Current Medications:  Current Outpatient Medications:  .  ARIPiprazole (ABILIFY) 2 MG tablet, Take 1.5 tablets (3 mg total) by mouth once for 1 dose., Disp: 45 tablet, Rfl: 5 .  buPROPion (WELLBUTRIN XL) 300 MG 24 hr tablet, Take 1 tablet (300 mg total) by mouth daily., Disp: 30 tablet, Rfl: 5 .  levothyroxine (SYNTHROID) 150 MCG tablet, Take 175 mcg by mouth daily. , Disp: , Rfl:  .  LORazepam (ATIVAN) 1 MG tablet, Take 0.5-1 tablets (0.5-1 mg total) by mouth every 8 (eight) hours as needed., Disp: 90 tablet, Rfl: 5 .  Vilazodone HCl (VIIBRYD) 40 MG TABS, Take 1 tablet (40 mg total) by mouth daily., Disp: 30 tablet, Rfl: 5 Medication Side Effects:  none  Family Medical/ Social History: Changes? Yes see above  MENTAL HEALTH EXAM:  Last menstrual period 02/17/2013.There is no height or weight on file to calculate BMI.  General Appearance: Unable to assess  Eye Contact:  Unable to assess  Speech:  Clear and Coherent  Volume:  Normal  Mood:  Sad  Affect:  Unable to assess  Thought Process:  Goal Directed  Orientation:  Full (Time, Place, and Person)  Thought Content: Logical    Suicidal Thoughts:  No  Homicidal Thoughts:  No  Memory:  WNL  Judgement:  Good  Insight:  Good  Psychomotor Activity:  Unable to assess  Concentration:  Concentration: Good  Recall:  Good  Fund of Knowledge: Good  Language: Good  Assets:  Desire for Improvement  ADL's:  Intact  Cognition: WNL  Prognosis:  Good    DIAGNOSES:    ICD-10-CM   1. Major depressive disorder, recurrent episode, moderate (HCC)  F33.1   2. Generalized anxiety disorder  F41.1   3. Insomnia, unspecified type  G47.00     Receiving Psychotherapy: No    RECOMMENDATIONS:  I am sorry to hear about her father's terminal diagnosis and the fact that her mom's dementia is worsening. Continue Abilify 3 mg every morning. Continue Wellbutrin XL 300 mg daily. Continue Viibryd 40 mg daily. Increase Ativan 1 mg, 1/2-1 every 8 hours as needed.  PDMP reviewed Recommend therapy. FMLA for 8 days/month for parent's appts. (If she needs it in future.) Return in 6 months.  Donnal Moat, PA-C   This record has been created using [employer].  Chart creation errors have been sought, but may not always have been located and corrected. Such creation errors do not reflect on the standard of medical care.

## 2019-02-04 ENCOUNTER — Encounter: Payer: Self-pay | Admitting: Gynecology

## 2019-04-25 ENCOUNTER — Other Ambulatory Visit: Payer: 59

## 2019-05-16 ENCOUNTER — Other Ambulatory Visit: Payer: Self-pay | Admitting: Physician Assistant

## 2019-06-24 ENCOUNTER — Other Ambulatory Visit: Payer: Self-pay | Admitting: Physician Assistant

## 2019-06-24 NOTE — Telephone Encounter (Signed)
Last apt 12/2018 due back this month

## 2019-07-11 ENCOUNTER — Other Ambulatory Visit: Payer: Self-pay | Admitting: Physician Assistant

## 2019-07-28 ENCOUNTER — Other Ambulatory Visit: Payer: Self-pay | Admitting: Physician Assistant

## 2019-07-29 NOTE — Telephone Encounter (Signed)
Last apt 12/2018,nothing scheduled

## 2019-08-01 ENCOUNTER — Ambulatory Visit: Payer: 59 | Attending: Internal Medicine

## 2019-08-01 DIAGNOSIS — Z23 Encounter for immunization: Secondary | ICD-10-CM

## 2019-08-01 NOTE — Progress Notes (Signed)
   Covid-19 Vaccination Clinic  Name:  Whitney Huynh    MRN: [id number] DOB: 12-21-1960  08/01/2019  [name] was observed post Covid-19 immunization for 15 minutes without incident. She was provided with Vaccine Information Sheet and instruction to access the V-Safe system.   [name] was instructed to call 911 with any severe reactions post vaccine: Marland Kitchen Difficulty breathing  . Swelling of face and throat  . A fast heartbeat  . A bad rash all over body  . Dizziness and weakness   Immunizations Administered    Name Date Dose VIS Date Route   Moderna COVID-19 Vaccine 08/01/2019 12:39 PM 0.5 mL 04/08/2019 Intramuscular   Manufacturer: Moderna   Lot: [id number]   [id number]

## 2019-08-13 ENCOUNTER — Other Ambulatory Visit: Payer: Self-pay | Admitting: Physician Assistant

## 2019-08-19 ENCOUNTER — Ambulatory Visit (INDEPENDENT_AMBULATORY_CARE_PROVIDER_SITE_OTHER): Payer: 59 | Admitting: Physician Assistant

## 2019-08-19 ENCOUNTER — Encounter: Payer: Self-pay | Admitting: Physician Assistant

## 2019-08-19 ENCOUNTER — Other Ambulatory Visit: Payer: Self-pay

## 2019-08-19 DIAGNOSIS — F411 Generalized anxiety disorder: Secondary | ICD-10-CM | POA: Diagnosis not present

## 2019-08-19 DIAGNOSIS — F331 Major depressive disorder, recurrent, moderate: Secondary | ICD-10-CM

## 2019-08-19 DIAGNOSIS — G4700 Insomnia, unspecified: Secondary | ICD-10-CM | POA: Diagnosis not present

## 2019-08-19 MED ORDER — ARIPIPRAZOLE 2 MG PO TABS: ORAL_TABLET | ORAL | 1 refills | Status: DC

## 2019-08-19 MED ORDER — VIIBRYD 40 MG PO TABS: 40.0000 mg | ORAL_TABLET | Freq: Every day | ORAL | 1 refills | Status: DC

## 2019-08-19 MED ORDER — BUPROPION HCL ER (XL) 300 MG PO TB24: 300.0000 mg | ORAL_TABLET | Freq: Every day | ORAL | 1 refills | Status: DC

## 2019-08-19 MED ORDER — LORAZEPAM 1 MG PO TABS: 1.0000 mg | ORAL_TABLET | Freq: Two times a day (BID) | ORAL | 1 refills | Status: DC | PRN

## 2019-08-19 NOTE — Progress Notes (Signed)
[facility]  Patient ID: Whitney Huynh,  MRN: [id number]  PCP: Patient, No Pcp Per  Date of Evaluation: 08/19/2019 Time spent:20 minutes  Chief Complaint:  Chief Complaint    Follow-up      HISTORY/CURRENT STATUS: HPI for routine med check.  Patient denies loss of interest in usual activities and is able to enjoy things.  Denies decreased energy or motivation.  Appetite has not changed.  No extreme sadness, tearfulness, or feelings of hopelessness.  Denies any changes in concentration, making decisions or remembering things.  Work is going well.  Denies any suicidal or homicidal thoughts.  Patient denies increased energy with decreased need for sleep, no increased talkativeness, no racing thoughts, no impulsivity or risky behaviors, no increased spending, no increased libido, no grandiosity, no increased irritability or anger, and no hallucinations.  Anxiety is well controlled.  She usually only takes the benzodiazepine in the evening but sometimes she needs it twice a day.  She sleeps well.  Denies dizziness, syncope, seizures, numbness, tingling, tremor, tics, unsteady gait, slurred speech, confusion. Denies muscle or joint pain, stiffness, or dystonia.Denies unexplained weight loss, frequent infections, or sores that heal slowly.  No polyphagia, polydipsia, or polyuria. Denies visual changes or paresthesias.   Individual Medical History/ Review of Systems: Changes? :No    Past medications for mental health diagnoses include: Zoloft, Prozac, Lexapro, Wellbutrin, Viibryd, Cymbalta, Ativan, Abilify  Allergies: Morphine and related  Current Medications:  Current Outpatient Medications:  .  ARIPiprazole (ABILIFY) 2 MG tablet, TAKE 1 AND 1/2 TABLETS (3 MG TOTAL) BY MOUTH ONCE A DAY, Disp: 135 tablet, Rfl: 1 .  buPROPion (WELLBUTRIN XL) 300 MG 24 hr tablet, Take 1 tablet (300 mg total) by mouth daily., Disp: 90 tablet, Rfl: 1 .  levothyroxine (SYNTHROID) 150 MCG tablet,  Take 175 mcg by mouth daily. Name Brand, Disp: , Rfl:  .  LORazepam (ATIVAN) 1 MG tablet, Take 1 tablet (1 mg total) by mouth 2 (two) times daily as needed for anxiety., Disp: 180 tablet, Rfl: 1 .  Vilazodone HCl (VIIBRYD) 40 MG TABS, Take 1 tablet (40 mg total) by mouth daily., Disp: 90 tablet, Rfl: 1 Medication Side Effects: none  Family Medical/ Social History: Changes? No  MENTAL HEALTH EXAM:  Last menstrual period 02/17/2013.There is no height or weight on file to calculate BMI.  General Appearance: Casual, Neat and Well Groomed  Eye Contact:  Good  Speech:  Clear and Coherent and Normal Rate  Volume:  Normal  Mood:  Euthymic  Affect:  Appropriate  Thought Process:  Goal Directed and Descriptions of Associations: Intact  Orientation:  Full (Time, Place, and Person)  Thought Content: Logical   Suicidal Thoughts:  No  Homicidal Thoughts:  No  Memory:  WNL  Judgement:  Good  Insight:  Good  Psychomotor Activity:  Normal  Concentration:  Concentration: Good  Recall:  Good  Fund of Knowledge: Good  Language: Good  Assets:  Desire for Improvement  ADL's:  Intact  Cognition: WNL  Prognosis:  Good    DIAGNOSES:    ICD-10-CM   1. Major depressive disorder, recurrent episode, moderate (HCC)  F33.1   2. Generalized anxiety disorder  F41.1   3. Insomnia, unspecified type  G47.00     Receiving Psychotherapy: No    RECOMMENDATIONS:  PDMP reviewed.  I spent 20 minutes with her. She is due for labs and will have those sent to me from PCP. Continue Abilify 2 mg, 1.5 pills daily.  Continue Wellbutrin XL 300 mg p.o. daily. Continue Ativan 1 mg, 1 p.o. twice daily as needed. Continue Viibryd 40 mg, 1 p.o. daily. Return in 6 months.  Donnal Moat, PA-C

## 2019-09-03 ENCOUNTER — Ambulatory Visit: Payer: 59

## 2019-09-09 ENCOUNTER — Ambulatory Visit: Payer: 59 | Attending: Internal Medicine

## 2019-09-09 DIAGNOSIS — Z23 Encounter for immunization: Secondary | ICD-10-CM

## 2019-09-09 NOTE — Progress Notes (Signed)
   Covid-19 Vaccination Clinic  Name:  Whitney Huynh    MRN: [id number] DOB: [birth year]  09/09/2019  Whitney Huynh was observed post Covid-19 immunization for 15 minutes without incident. She was provided with Vaccine Information Sheet and instruction to access the V-Safe system.   Whitney Huynh was instructed to call 911 with any severe reactions post vaccine: Marland Kitchen Difficulty breathing  . Swelling of face and throat  . A fast heartbeat  . A bad rash all over body  . Dizziness and weakness   Immunizations Administered    Name Date Dose VIS Date Route   Moderna COVID-19 Vaccine 09/09/2019 12:20 PM 0.5 mL 04/2019 Intramuscular   Manufacturer: Moderna   Lot: [id number]   [id number]

## 2019-09-23 ENCOUNTER — Encounter: Payer: Self-pay | Admitting: Physician Assistant

## 2019-10-20 ENCOUNTER — Encounter: Payer: Self-pay | Admitting: Physician Assistant

## 2019-10-20 ENCOUNTER — Other Ambulatory Visit (INDEPENDENT_AMBULATORY_CARE_PROVIDER_SITE_OTHER): Payer: 59

## 2019-10-20 ENCOUNTER — Ambulatory Visit: Payer: 59 | Admitting: Physician Assistant

## 2019-10-20 VITALS — BP 130/94 | HR 84 | Ht 64.5 in | Wt 250.5 lb

## 2019-10-20 DIAGNOSIS — K869 Disease of pancreas, unspecified: Secondary | ICD-10-CM

## 2019-10-20 DIAGNOSIS — R7989 Other specified abnormal findings of blood chemistry: Secondary | ICD-10-CM

## 2019-10-20 DIAGNOSIS — K862 Cyst of pancreas: Secondary | ICD-10-CM

## 2019-10-20 LAB — HEPATIC FUNCTION PANEL
ALT: 39 U/L — ABNORMAL HIGH (ref 0–35)
AST: 33 U/L (ref 0–37)
Albumin: 4.7 g/dL (ref 3.5–5.2)
Alkaline Phosphatase: 72 U/L (ref 39–117)
Bilirubin, Direct: 0.1 mg/dL (ref 0.0–0.3)
Total Bilirubin: 0.4 mg/dL (ref 0.2–1.2)
Total Protein: 7.5 g/dL (ref 6.0–8.3)

## 2019-10-20 NOTE — Patient Instructions (Addendum)
If you are age 57 or older, your body mass index should be between 23-30. Your Body mass index is 42.33 kg/m. If this is out of the aforementioned range listed, please consider follow up with your Primary Care Provider.  If you are age 6 or younger, your body mass index should be between 19-25. Your Body mass index is 42.33 kg/m. If this is out of the aformentioned range listed, please consider follow up with your Primary Care Provider.   Your provider has requested that you go to the basement level for lab work before leaving today. Press "B" on the elevator. The lab is located at the first door on the left as you exit the elevator.  Due to recent changes in healthcare laws, you may see the results of your imaging and laboratory studies on MyChart before your provider has had a chance to review them.  We understand that in some cases there may be results that are confusing or concerning to you. Not all laboratory results come back in the same time frame and the provider may be waiting for multiple results in order to interpret others.  Please give [place] 48 hours in order for your provider to thoroughly review all the results before contacting the office for clarification of your results.   Amy will be discussing your visit with Dr. Ardis Hughs and Dr. Rush Landmark to decide the next steps in your work up.  Follow up pending the results of labs.

## 2019-10-21 LAB — CANCER ANTIGEN 19-9: CA 19-9: 24 U/mL (ref <34)

## 2019-10-22 ENCOUNTER — Encounter: Payer: Self-pay | Admitting: Physician Assistant

## 2019-10-22 NOTE — Progress Notes (Signed)
Subjective:    Patient ID: Whitney Huynh, female    DOB: Mar 24, 1961, 59 y.o.   MRN: [id number]  HPI [name] is a pleasant 59 year old white female, new to GI today and referred by Leola Brazil, PA-C/[facility] for further evaluation of pancreatic cystic lesion. Patient has history of Graves' disease, obesity, anxiety and depression.  She has not had any prior GI evaluation though did do a Cologuard she believes in 2019 which was negative. She had undergone initial CT in February 2020 for evaluation of potential kidney stone and incidentally was noted to have a 3.4 x 2.4 cm low-attenuation focus within the uncinate process, felt possibly to represent a pseudocyst or potential cystic pancreatic neoplasm.  There was an additional tiny focus identified within the pancreatic neck/body junction.  MRI was recommended. MRI in March 2020 showed this to be a 2.7 x 2.9 cm septated cystic structure in the pancreatic head which could represent a complicated cyst or small cystic neoplasm, there is also a tiny cyst in the pancreatic neck and 27-month follow-up was recommended, also noted a small hepatic cyst. Patient underwent follow-up MRI in May 2021 which now shows a significant decrease in size of the complex cystic lesion in the uncinate process with enhancing septation now measuring 13 x 12 mm, also with 5 mm cyst noted in the pancreatic neck.  Lesion felt consistent with pseudocyst versus cystic neoplasm and EUS was recommended. She had labs done in May 2021 with LFTs showing mildly elevated AST at 52 and ALT 69. Patient has not had any complaint of abdominal pain at all over the past year.  She has no history of pancreatitis.  Appetite has been fine, weight is actually up.  No complaints of nausea or dyspepsia. There is no family history of GI disease that she is aware of. Regarding the mild transaminitis, she says she had been drinking some wine on most evenings, but stopped over the past month with finding of  mildly elevated LFTs.  She has not been told that she had elevated LFTs at any point in the past.  Review of Systems Pertinent positive and negative review of systems were noted in the above HPI section.  All other review of systems was otherwise negative.  Outpatient Encounter Medications as of 10/20/2019  Medication Sig  . ARIPiprazole (ABILIFY) 2 MG tablet TAKE 1 AND 1/2 TABLETS (3 MG TOTAL) BY MOUTH ONCE A DAY  . buPROPion (WELLBUTRIN XL) 300 MG 24 hr tablet Take 1 tablet (300 mg total) by mouth daily.  Marland Kitchen levothyroxine (SYNTHROID) 150 MCG tablet Take 175 mcg by mouth daily. Name Brand  . LORazepam (ATIVAN) 1 MG tablet Take 1 tablet (1 mg total) by mouth 2 (two) times daily as needed for anxiety.  . Vilazodone HCl (VIIBRYD) 40 MG TABS Take 1 tablet (40 mg total) by mouth daily.   No facility-administered encounter medications on file as of 10/20/2019.   Allergies  Allergen Reactions  . Morphine And Related Itching   Patient Active Problem List   Diagnosis Date Noted  . Insomnia 06/11/2018  . Weight gain 06/11/2018  . Grave's disease   . Depression   . Anxiety    Social History   Socioeconomic History  . Marital status: Married    Spouse name: Not on file  . Number of children: 3  . Years of education: Not on file  . Highest education level: Not on file  Occupational History  . Occupation: Runner, broadcasting/film/video  Tobacco Use  .  Smoking status: Never Smoker  . Smokeless tobacco: Never Used  Vaping Use  . Vaping Use: Never used  Substance and Sexual Activity  . Alcohol use: Yes    Alcohol/week: 0.0 - 1.0 standard drinks    Comment: occasional  . Drug use: No  . Sexual activity: Yes    Birth control/protection: Surgical    Comment: BTL-1st intercourse 61 yo-1 partner  Other Topics Concern  . Not on file  Social History Narrative  . Not on file   Social Determinants of Health   Financial Resource Strain:   . Difficulty of Paying Living Expenses:   Food Insecurity:   .  Worried About Charity fundraiser in the Last Year:   . Arboriculturist in the Last Year:   Transportation Needs:   . Film/video editor (Medical):   Marland Kitchen Lack of Transportation (Non-Medical):   Physical Activity:   . Days of Exercise per Week:   . Minutes of Exercise per Session:   Stress:   . Feeling of Stress :   Social Connections:   . Frequency of Communication with Friends and Family:   . Frequency of Social Gatherings with Friends and Family:   . Attends Religious Services:   . Active Member of Clubs or Organizations:   . Attends Archivist Meetings:   Marland Kitchen Marital Status:   Intimate Partner Violence:   . Fear of Current or Ex-Partner:   . Emotionally Abused:   Marland Kitchen Physically Abused:   . Sexually Abused:     Ms. Pendley family history includes Bladder Cancer in her father; Irritable bowel syndrome in her mother; Lymphoma in her mother; Prostate cancer in her father.      Objective:    Vitals:   10/20/19 1503  BP: (!) 130/94  Pulse: 84    Physical Exam Well-developed well-nourished white female in no acute distress.  Height, Weight, BMI 42.3 HEENT; nontraumatic normocephalic, EOMI, PER R LA, sclera anicteric. Oropharynx; not examined Neck; supple, no JVD Cardiovascular; regular rate and rhythm with S1-S2, no murmur rub or gallop Pulmonary; Clear bilaterally Abdomen; soft, obese, nontender, nondistended, no palpable mass or hepatosplenomegaly, bowel sounds are active Rectal; not done today Skin; benign exam, no jaundice rash or appreciable lesions Extremities; no clubbing cyanosis or edema skin warm and dry Neuro/Psych; alert and oriented x4, grossly nonfocal mood and affect appropriate       Assessment & Plan:   #30  59 year old white female with persistent pancreatic head complex cystic lesion, initially noted on CT scan in February 2020, then MRI March 2020 and now with follow-up MRI May 2021 showing significant decrease in size of this cystic  structure. Patient has been asymptomatic, and no history of pancreatitis to explain potential pseudocyst. Rule out resolving small pseudocyst, other benign pancreatic cyst, versus cystic neoplasm which is less likely given decrease in size over the past year  #2 mild transaminitis probably secondary to fatty liver 3.  Obesity 4.  History of Graves' disease 5.  Colon cancer screening-negative Cologuard 2019, negative family history average risk  Plan; Discussed EUS with patient in detail, and she is agreeable to proceed with EUS.  Will discuss with Dr. Wendie Agreste. Mansouraty to determine scheduling. Repeat hepatic panel today, CA 19-9. Patient will be established with Dr. Tarri Glenn. Also discussed indication for colonoscopy about 3 years post negative Cologuard.  Patient's primary objection to colonoscopy had been the prep, after this was further discussed she is much more amenable to  colonoscopy and suggested she consider for 2022.   Vignesh Willert Genia Harold PA-C 10/22/2019   Cc: Nicholes Rough, PA-C

## 2019-10-23 NOTE — Progress Notes (Signed)
Recall in epic. 

## 2019-10-23 NOTE — Progress Notes (Signed)
Many thanks for the input. Whitney Huynh, Please schedule a follow-up MRI in 4-6 months. Thank you.

## 2019-11-09 ENCOUNTER — Other Ambulatory Visit: Payer: Self-pay | Admitting: Physician Assistant

## 2019-11-13 ENCOUNTER — Other Ambulatory Visit: Payer: Self-pay | Admitting: Physician Assistant

## 2019-11-13 NOTE — Telephone Encounter (Signed)
Next apt 02/18/2020

## 2019-12-20 ENCOUNTER — Other Ambulatory Visit: Payer: Self-pay | Admitting: Physician Assistant

## 2019-12-21 NOTE — Telephone Encounter (Signed)
Apt 10/31

## 2019-12-22 ENCOUNTER — Other Ambulatory Visit: Payer: Self-pay

## 2019-12-22 DIAGNOSIS — K869 Disease of pancreas, unspecified: Secondary | ICD-10-CM

## 2019-12-22 DIAGNOSIS — K862 Cyst of pancreas: Secondary | ICD-10-CM

## 2020-01-14 ENCOUNTER — Other Ambulatory Visit: Payer: Self-pay | Admitting: Physician Assistant

## 2020-01-14 ENCOUNTER — Ambulatory Visit (HOSPITAL_COMMUNITY)
Admission: RE | Admit: 2020-01-14 | Discharge: 2020-01-14 | Disposition: A | Discharge status: Home / Self Care | Payer: 59 | Source: Ambulatory Visit | Attending: Physician Assistant | Admitting: Physician Assistant

## 2020-01-14 DIAGNOSIS — K862 Cyst of pancreas: Secondary | ICD-10-CM

## 2020-01-14 NOTE — Progress Notes (Signed)
Patient was scheduled for MRI Pancreas wo/w 9/8 at 8am. Patient was a no call no show for her MRI appointment.  Please call centralized Scheduling to reschedule her appointment at [facility].  Thank you! Whitney Huynh

## 2020-01-15 ENCOUNTER — Other Ambulatory Visit: Payer: Self-pay | Admitting: Physician Assistant

## 2020-01-22 ENCOUNTER — Other Ambulatory Visit: Payer: Self-pay | Admitting: Physician Assistant

## 2020-01-23 NOTE — Telephone Encounter (Signed)
Apt 10/13

## 2020-02-18 ENCOUNTER — Ambulatory Visit (INDEPENDENT_AMBULATORY_CARE_PROVIDER_SITE_OTHER): Payer: 59 | Admitting: Physician Assistant

## 2020-02-18 ENCOUNTER — Encounter: Payer: Self-pay | Admitting: Physician Assistant

## 2020-02-18 ENCOUNTER — Other Ambulatory Visit: Payer: Self-pay

## 2020-02-18 DIAGNOSIS — G4700 Insomnia, unspecified: Secondary | ICD-10-CM

## 2020-02-18 DIAGNOSIS — F411 Generalized anxiety disorder: Secondary | ICD-10-CM | POA: Diagnosis not present

## 2020-02-18 DIAGNOSIS — F3341 Major depressive disorder, recurrent, in partial remission: Secondary | ICD-10-CM

## 2020-02-18 MED ORDER — LORAZEPAM 1 MG PO TABS: 1.0000 mg | ORAL_TABLET | Freq: Three times a day (TID) | ORAL | 1 refills | Status: DC | PRN

## 2020-02-18 MED ORDER — VIIBRYD 40 MG PO TABS: 40.0000 mg | ORAL_TABLET | Freq: Every day | ORAL | 1 refills | Status: DC

## 2020-02-18 MED ORDER — BUPROPION HCL ER (XL) 300 MG PO TB24: 300.0000 mg | ORAL_TABLET | Freq: Every day | ORAL | 1 refills | Status: DC

## 2020-02-18 NOTE — Progress Notes (Signed)
[facility]  Patient ID: Whitney Huynh,  MRN: [id number]  PCP: Nicholes Rough, PA-C  Date of Evaluation: 02/18/2020 Time spent:30 minutes  Chief Complaint:  Chief Complaint    Anxiety; Depression      HISTORY/CURRENT STATUS: HPI for routine med check.  Whitney Huynh is having surgery on lumbar spine next week.  She has been in a lot of pain for months now.  She is really looking forward to feeling better.  As far as her mental health goes, she feels that the medications are still working well.  She really did not do much of anything this summer because she was in pain all the time.  But she does not feel depressed.  She is able to enjoy some things that do not aggravate the pain.  Energy and motivation are fine.  Work is going well.  She is not isolating or crying easily.  Denies any suicidal or homicidal thoughts.  Patient denies increased energy with decreased need for sleep, no increased talkativeness, no racing thoughts, no impulsivity or risky behaviors, no increased spending, no increased libido, no grandiosity, no increased irritability or anger, and no hallucinations.  Anxiety is well controlled.  She usually only takes the benzodiazepine in the evening but sometimes she needs it twice a day.   Denies dizziness, syncope, seizures, numbness, tingling, tremor, tics, unsteady gait, slurred speech, confusion. Denies unexplained weight loss, frequent infections, or sores that heal slowly.  No polyphagia, polydipsia, or polyuria. Denies visual changes or paresthesias.   Individual Medical History/ Review of Systems: Changes? :Yes is having L-spine surgery next week.    Past medications for mental health diagnoses include: Zoloft, Prozac, Lexapro, Wellbutrin, Viibryd, Cymbalta, Ativan, Abilify  Allergies: Morphine and related  Current Medications:  Current Outpatient Medications:  .  ARIPiprazole (ABILIFY) 2 MG tablet, TAKE 1 AND 1/2 TABLETS BY MOUTH EVERY DAY, Disp: 135  tablet, Rfl: 2 .  buPROPion (WELLBUTRIN XL) 300 MG 24 hr tablet, Take 1 tablet (300 mg total) by mouth daily., Disp: 90 tablet, Rfl: 1 .  gabapentin (NEURONTIN) 600 MG tablet, Take by mouth., Disp: , Rfl:  .  levothyroxine (SYNTHROID) 150 MCG tablet, Take 175 mcg by mouth daily. Name Brand, Disp: , Rfl:  .  LORazepam (ATIVAN) 1 MG tablet, Take 1 tablet (1 mg total) by mouth every 8 (eight) hours as needed for anxiety., Disp: 90 tablet, Rfl: 1 .  Vilazodone HCl (VIIBRYD) 40 MG TABS, Take 1 tablet (40 mg total) by mouth daily., Disp: 90 tablet, Rfl: 1 .  HYDROcodone-acetaminophen (NORCO/VICODIN) 5-325 MG tablet, Take by mouth., Disp: , Rfl:  Medication Side Effects: none  Family Medical/ Social History: Changes? No  MENTAL HEALTH EXAM:  Last menstrual period 02/17/2013.There is no height or weight on file to calculate BMI.  General Appearance: Casual, Neat and Well Groomed  Eye Contact:  Good  Speech:  Clear and Coherent and Normal Rate  Volume:  Normal  Mood:  Euthymic  Affect:  Appropriate  Thought Process:  Goal Directed and Descriptions of Associations: Intact  Orientation:  Full (Time, Place, and Person)  Thought Content: Logical   Suicidal Thoughts:  No  Homicidal Thoughts:  No  Memory:  WNL  Judgement:  Good  Insight:  Good  Psychomotor Activity:  Has difficulty arising from a sitting position but otherwise normal  Concentration:  Concentration: Good  Recall:  Good  Fund of Knowledge: Good  Language: Good  Assets:  Desire for Improvement  ADL's:  Intact  Cognition: WNL  Prognosis:  Good   10/02/2019 Glucose 94, lipid panel total cholesterol 183, triglycerides 112, HDL 58, LDL was 105  DIAGNOSES:    ICD-10-CM   1. Recurrent major depressive disorder, in partial remission ([place])  F33.41   2. Generalized anxiety disorder  F41.1   3. Insomnia, unspecified type  G47.00     Receiving Psychotherapy: No    RECOMMENDATIONS:  PDMP reviewed.  I provided 30 minutes of  face-to-face time during this encounter, including reviewing labs. I am glad she is doing well mentally, and hope she recovers quickly from the back surgery. Continue Abilify 2 mg, 1.5 pills daily. Continue Wellbutrin XL 300 mg p.o. daily. Continue Ativan 1 mg, 1 p.o. twice daily as needed. Continue Viibryd 40 mg, 1 p.o. daily. Return in 6 months.  Donnal Moat, PA-C

## 2020-04-06 ENCOUNTER — Telehealth: Payer: Self-pay | Admitting: Physician Assistant

## 2020-04-07 NOTE — Telephone Encounter (Signed)
Spoke with the patient and rescheduled her MRI to 04/14/20 at 7:00 am in [facility]. Instructed to fast and to arrive at 6:45 am.

## 2020-04-14 ENCOUNTER — Ambulatory Visit (HOSPITAL_COMMUNITY)
Admission: RE | Admit: 2020-04-14 | Discharge: 2020-04-14 | Disposition: A | Discharge status: Home / Self Care | Payer: 59 | Source: Ambulatory Visit | Attending: Physician Assistant | Admitting: Physician Assistant

## 2020-04-14 ENCOUNTER — Other Ambulatory Visit: Payer: Self-pay

## 2020-04-14 ENCOUNTER — Telehealth: Payer: Self-pay | Admitting: Physician Assistant

## 2020-04-14 DIAGNOSIS — K862 Cyst of pancreas: Secondary | ICD-10-CM

## 2020-04-14 IMAGING — MR MR 3D RECON AT SCANNER
18 of 20 series · 45 of 48 positions shown · IV contrast (gadavist)
Comparison: Report of an outside abdominal MRI of [DATE].

CLINICAL DATA: Follow-up of pancreatic cystic lesion.

EXAM:
MRI ABDOMEN WITHOUT AND WITH CONTRAST
TECHNIQUE: Multiplanar multisequence MR imaging of the abdomen was performed
both before and after the administration of intravenous contrast.
CONTRAST:  10mL GADAVIST GADOBUTROL 1 MMOL/ML IV SOLN

[Series 4: T2 · coronal · 7.0mm · 1.76mm/px · 2 of 30 slices shown (1 of 2)]
[im 1/30]
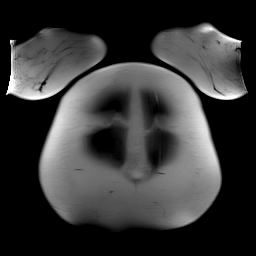
[im 30/30]
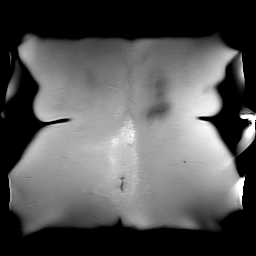

[Series 5: T2 fat-sat · axial · 6.0mm · 1.41mm/px · 1 of 40 slices shown]
[im 1/40]
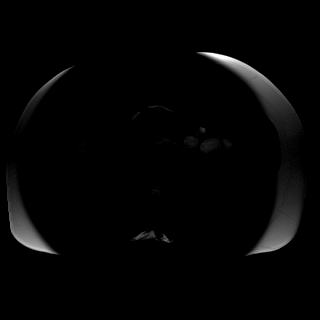

[Series 7: T1 · axial · 3.8mm · 1.41mm/px · z∈[-109,+191]mm · 3 of 80 slices shown (1 of 2)]
[im 1/80]
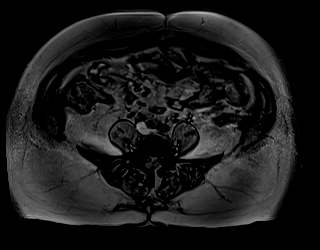
[im 40/80]
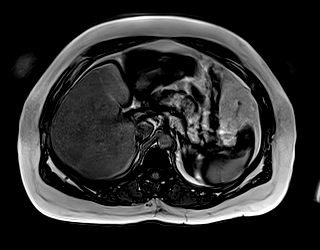
[im 80/80]
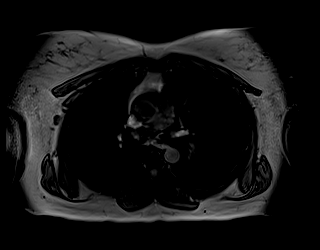

[Series 8: T1 · axial · 3.8mm · 1.41mm/px · z∈[-109,+191]mm · 3 of 80 slices shown (2 of 2)]
[im 1/80]
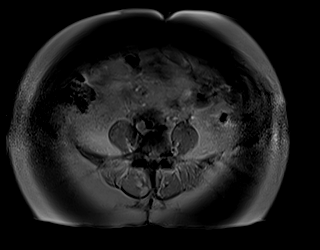
[im 40/80]
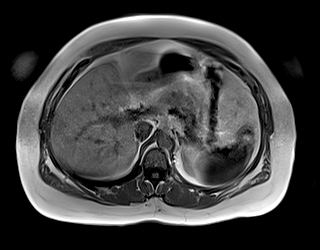
[im 80/80]
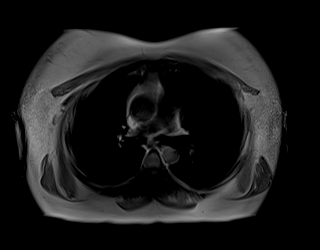

[Series 9: DWI · axial · 6.0mm · 1.68mm/px · z∈[-97,+199]mm · 3 of 84 slices shown (1 of 2)]
[im 1/84]
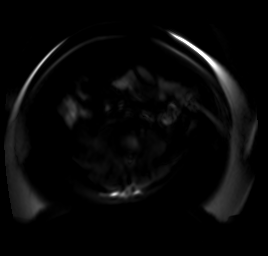
[im 42/84]
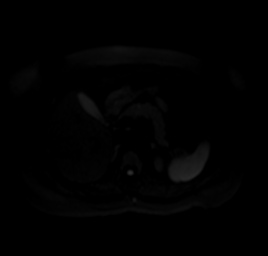
[im 84/84]
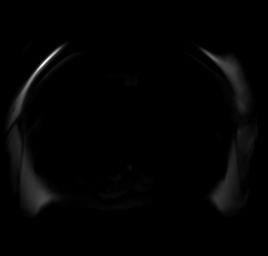

[Series 10: DWI · axial · 6.0mm · 1.68mm/px · z∈[-97,+199]mm · 2 of 42 slices shown (2 of 2)]
[im 1/42]
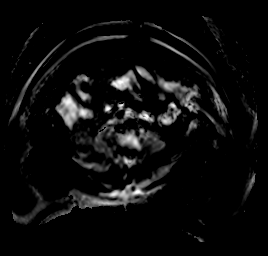
[im 42/42]
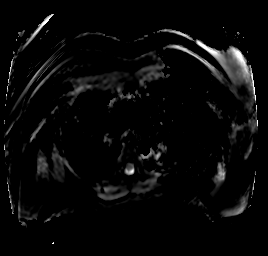

[Series 11: bSSFP · axial · 7.0mm · 1.31mm/px · 1 of 36 slices shown]
[im 1/36]
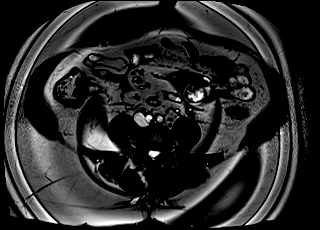

[Series 14: T1 dynamic · axial · 3.8mm · 1.38mm/px · z∈[-112,+218]mm · 3 of 88 slices shown (1 of 6)]
[im 1/88]
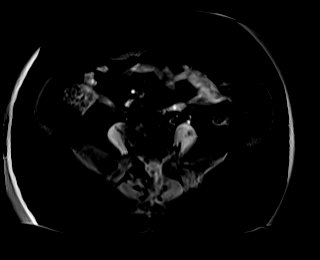
[im 44/88]
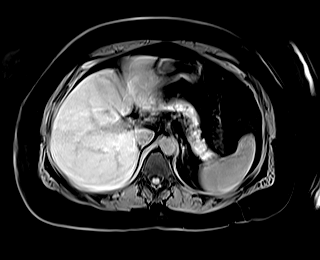
[im 88/88]
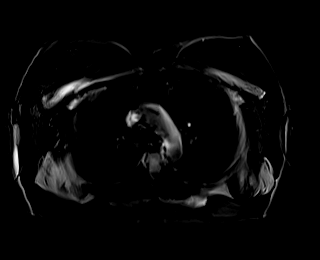

[Series 17: T1 dynamic · axial · 3.8mm · 1.38mm/px · z∈[-112,+218]mm · 3 of 88 slices shown (2 of 6)]
[im 1/88]
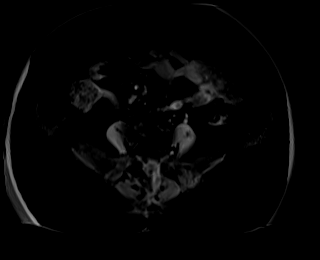
[im 44/88]
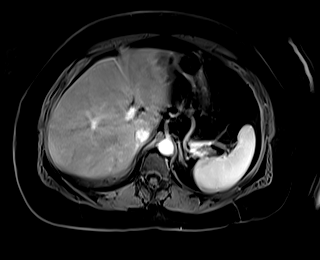
[im 88/88]
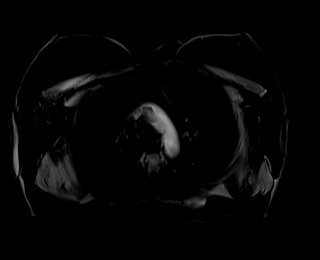

[Series 19: T1 dynamic · axial · 3.8mm · 1.38mm/px · z∈[-112,+218]mm · 3 of 88 slices shown (3 of 6)]
[im 1/88]
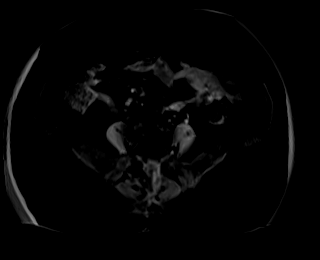
[im 44/88]
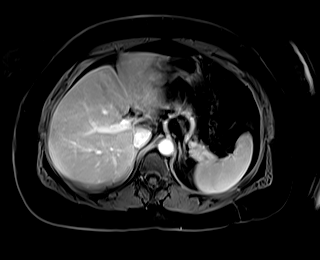
[im 88/88]
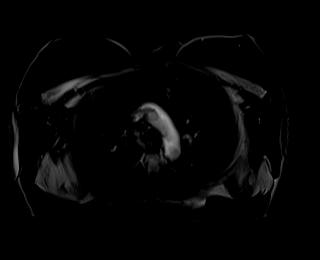

[Series 21: T1 dynamic · axial · 3.8mm · 1.38mm/px · z∈[-112,+218]mm · 3 of 88 slices shown (4 of 6)]
[im 1/88]
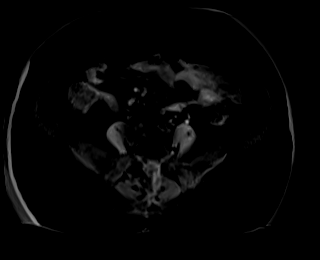
[im 44/88]
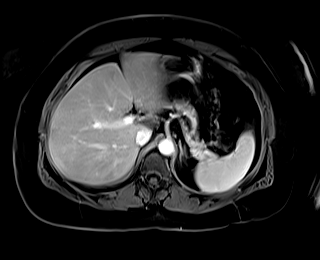
[im 88/88]
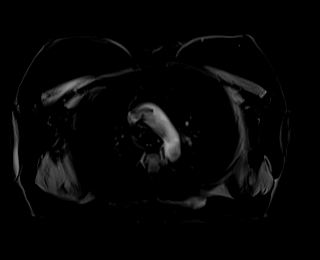

[Series 24: T1 dynamic · coronal · 5.0mm · 1.41mm/px · 2 of 56 slices shown (5 of 6)]
[im 1/56]
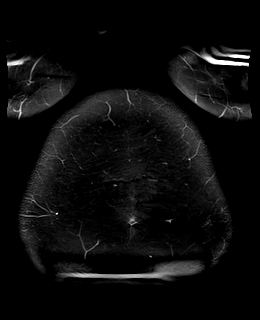
[im 56/56]
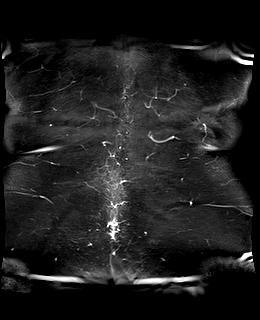

[Series 25: T2 · axial · 6.0mm · 1.72mm/px · 1 of 34 slices shown (2 of 2)]
[im 1/34]
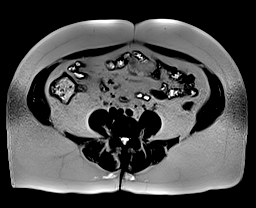

[Series 27: T1 dynamic · axial · 3.8mm · 1.38mm/px · z∈[-112,+218]mm · 3 of 88 slices shown (6 of 6)]
[im 1/88]
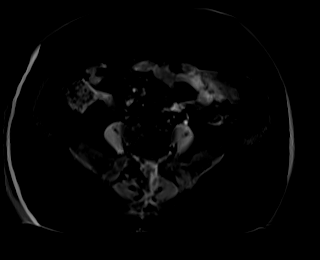
[im 44/88]
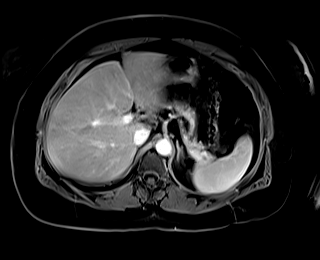
[im 88/88]
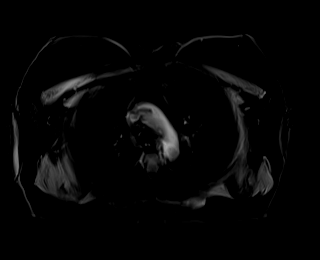

[Series 102: sub_20 sec · axial · 3.8mm · 1.38mm/px · z∈[-112,+218]mm · 3 of 88 slices shown]
[im 1/88]
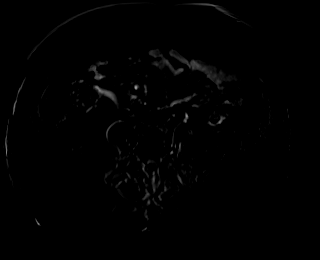
[im 44/88]
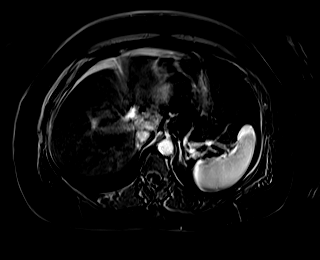
[im 88/88]
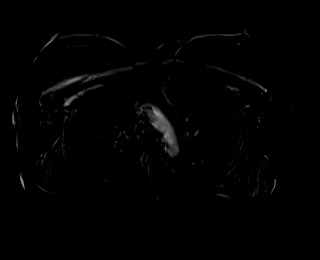

[Series 103: sub_45 sec · axial · 3.8mm · 1.38mm/px · z∈[-112,+218]mm · 3 of 88 slices shown]
[im 1/88]
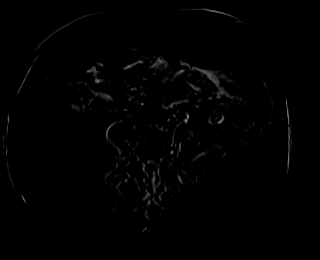
[im 44/88]
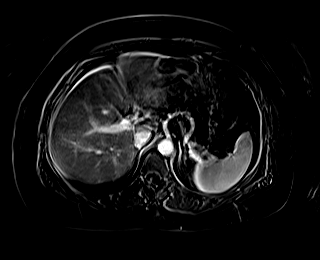
[im 88/88]
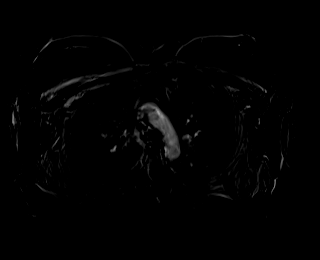

[Series 104: sub_90 sec · axial · 3.8mm · 1.38mm/px · z∈[-112,+218]mm · 3 of 88 slices shown]
[im 1/88]
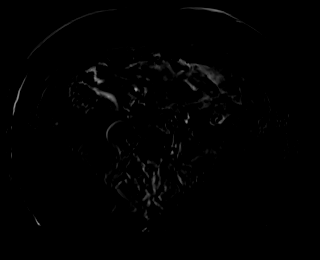
[im 44/88]
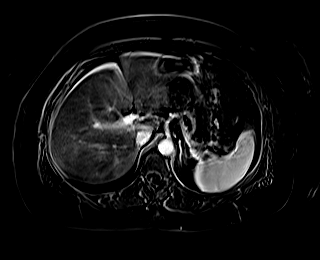
[im 88/88]
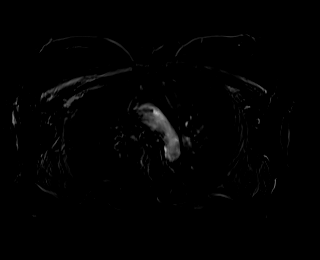

[Series 105: sub_delay · axial · 3.8mm · 1.38mm/px · z∈[-90,+218]mm · 3 of 82 slices shown]
[im 1/82]
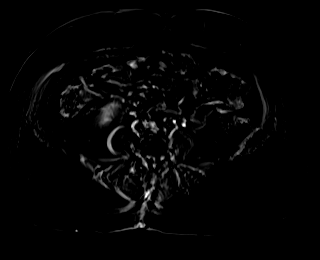
[im 41/82]
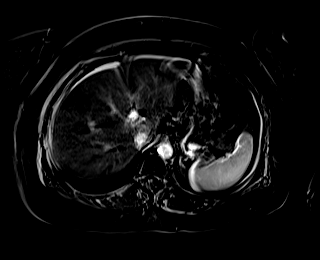
[im 82/82]
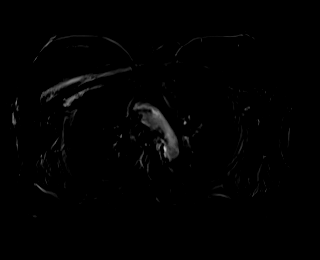

[45 of 48 positions shown; findings below may reference images not displayed]

This
describes a 13 x 12 mm cystic lesion in the uncinate process of the
pancreas in the 5 mm cystic lesion in the neck of the pancreas.
FINDINGS: Lower chest: Portions of exam are mildly motion degraded. Normal
heart size without pericardial or pleural effusion.

Hepatobiliary: Mild hepatic steatosis. No suspicious liver lesion.
Normal gallbladder, without biliary ductal dilatation.

Pancreas: Tiny cystic lesion within the pancreatic neck at 3-4 mm on
16/25.

Uncinate process multi septated cystic lesion measures 1.2 x 1.4 cm
on [DATE]. 1.6 cm craniocaudal on coronal image 13. No suspicious
postcontrast characteristics. No communication with the main
pancreatic duct. No duct dilatation or acute inflammation.

Spleen:  Normal in size, without focal abnormality.

Adrenals/Urinary Tract: Normal adrenal glands. Normal kidneys,
without hydronephrosis.

Stomach/Bowel: Normal stomach and abdominal bowel loops.

Vascular/Lymphatic: Aortic atherosclerosis. No retroperitoneal or
retrocrural adenopathy.

Other:  No ascites.

Musculoskeletal: No acute osseous abnormality.
IMPRESSION: 1. 14 mm multi septated cystic lesion within the pancreatic uncinate
process. Per comparison with the outside MRI report of [DATE],
relatively similar in size. Differential considerations include
pseudocyst or indolent cystic neoplasm (morphology would be typical
for microcystic serous cystadenoma). Per consensus criteria, 1 year
of stability is required to confirm benignity. Therefore, recommend
follow-up with pre and post contrast abdominal MRI at 6-12 months.
This recommendation follows ACR consensus guidelines: Management of
Incidental Pancreatic Cysts: A White Paper of the ACR Incidental
Findings Committee. [HOSPITAL] [UJ];[DATE].
2. Mild motion degradation.
3. Mild hepatic steatosis.
4. The smaller cystic lesion the pancreatic neck is of doubtful
clinical significance and can be re-evaluated on follow-up.
5.  Aortic Atherosclerosis ([UJ]-[UJ]).

## 2020-04-14 IMAGING — MR MR ABDOMEN WO/W CM
18 of 20 series · 45 of 48 positions shown · IV contrast (gadavist)
Comparison: Report of an outside abdominal MRI of [DATE].

CLINICAL DATA: Follow-up of pancreatic cystic lesion.

EXAM:
MRI ABDOMEN WITHOUT AND WITH CONTRAST
TECHNIQUE: Multiplanar multisequence MR imaging of the abdomen was performed
both before and after the administration of intravenous contrast.
CONTRAST:  10mL GADAVIST GADOBUTROL 1 MMOL/ML IV SOLN

[Series 4: T2 · coronal · 7.0mm · 1.76mm/px · 2 of 30 slices shown (1 of 2)]
[im 1/30]
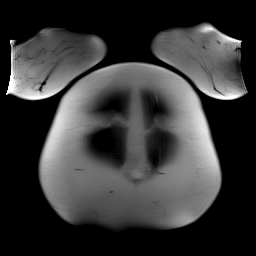
[im 30/30]
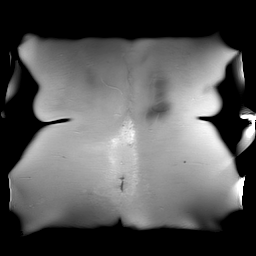

[Series 5: T2 fat-sat · axial · 6.0mm · 1.41mm/px · 1 of 40 slices shown]
[im 1/40]
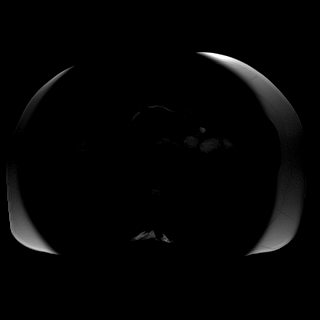

[Series 7: T1 · axial · 3.8mm · 1.41mm/px · z∈[-109,+191]mm · 3 of 80 slices shown (1 of 2)]
[im 1/80]
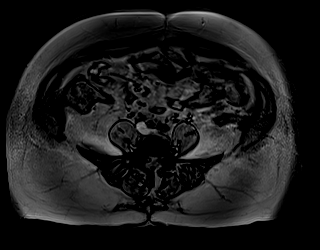
[im 40/80]
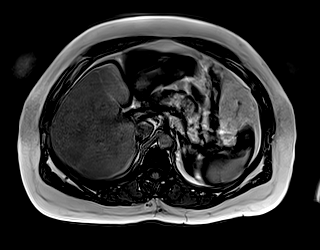
[im 80/80]
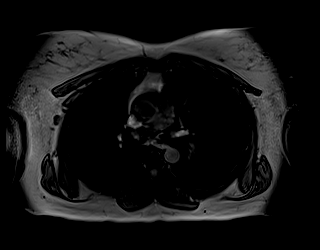

[Series 8: T1 · axial · 3.8mm · 1.41mm/px · z∈[-109,+191]mm · 3 of 80 slices shown (2 of 2)]
[im 1/80]
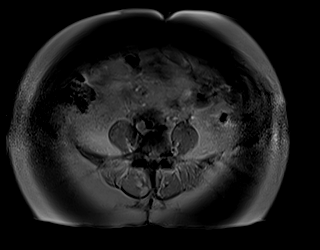
[im 40/80]
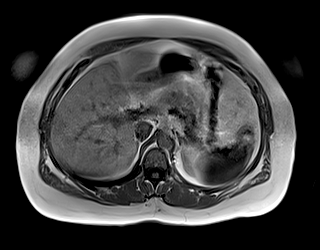
[im 80/80]
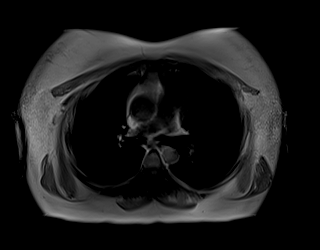

[Series 9: DWI · axial · 6.0mm · 1.68mm/px · z∈[-97,+199]mm · 3 of 84 slices shown (1 of 2)]
[im 1/84]
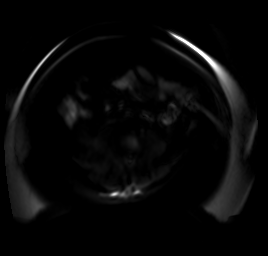
[im 42/84]
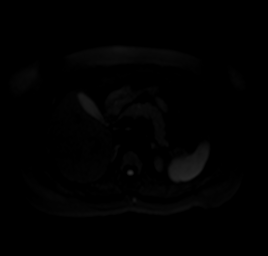
[im 84/84]
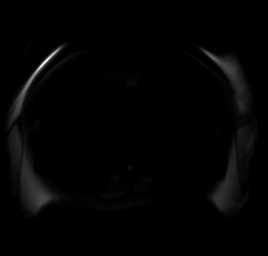

[Series 10: DWI · axial · 6.0mm · 1.68mm/px · z∈[-97,+199]mm · 2 of 42 slices shown (2 of 2)]
[im 1/42]
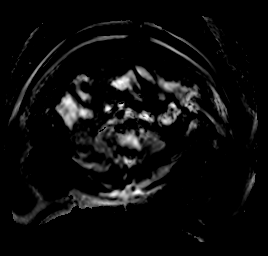
[im 42/42]
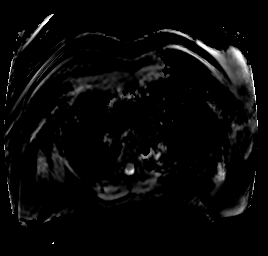

[Series 11: bSSFP · axial · 7.0mm · 1.31mm/px · 1 of 36 slices shown]
[im 1/36]
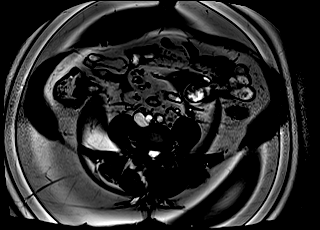

[Series 14: T1 dynamic · axial · 3.8mm · 1.38mm/px · z∈[-112,+218]mm · 3 of 88 slices shown (1 of 6)]
[im 1/88]
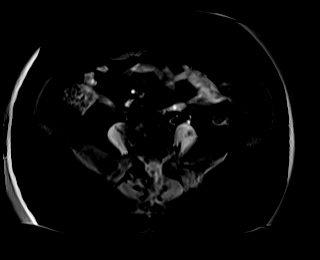
[im 44/88]
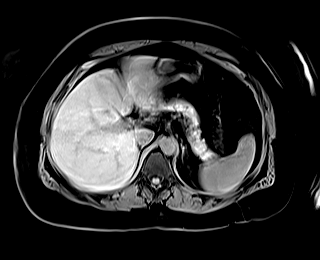
[im 88/88]
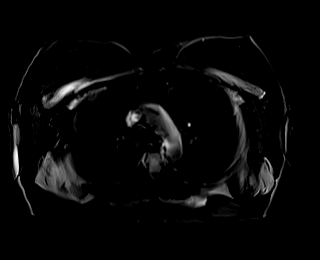

[Series 17: T1 dynamic · axial · 3.8mm · 1.38mm/px · z∈[-112,+218]mm · 3 of 88 slices shown (2 of 6)]
[im 1/88]
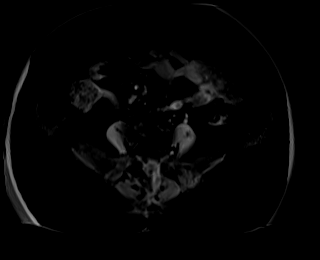
[im 44/88]
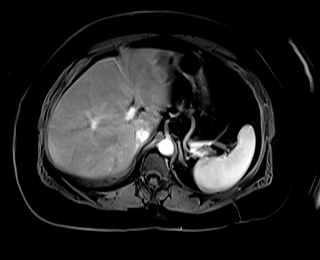
[im 88/88]
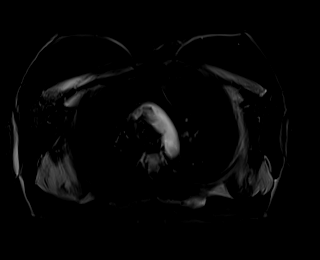

[Series 19: T1 dynamic · axial · 3.8mm · 1.38mm/px · z∈[-112,+218]mm · 3 of 88 slices shown (3 of 6)]
[im 1/88]
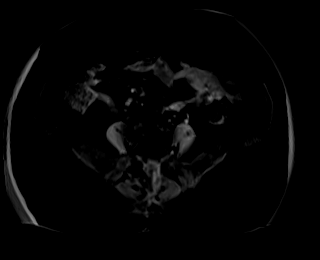
[im 44/88]
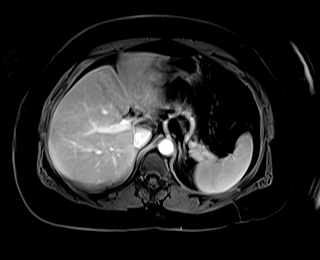
[im 88/88]
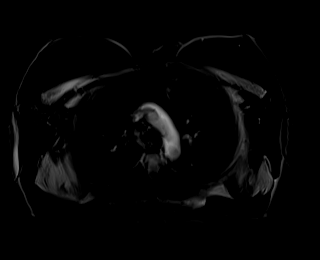

[Series 21: T1 dynamic · axial · 3.8mm · 1.38mm/px · z∈[-112,+218]mm · 3 of 88 slices shown (4 of 6)]
[im 1/88]
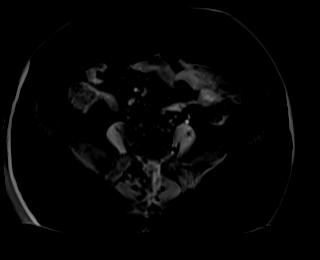
[im 44/88]
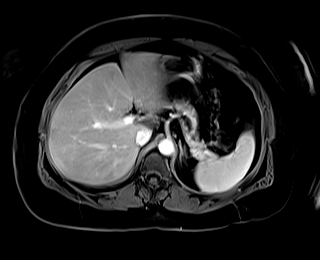
[im 88/88]
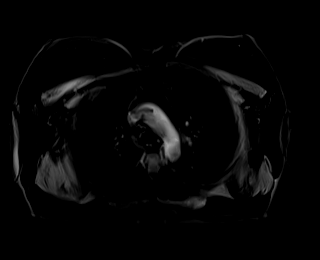

[Series 24: T1 dynamic · coronal · 5.0mm · 1.41mm/px · 2 of 56 slices shown (5 of 6)]
[im 1/56]
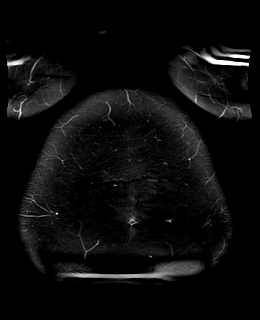
[im 56/56]
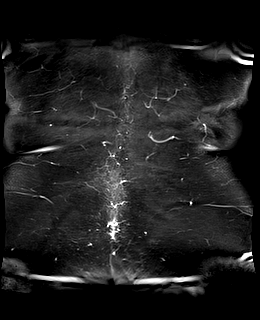

[Series 25: T2 · axial · 6.0mm · 1.72mm/px · 1 of 34 slices shown (2 of 2)]
[im 1/34]
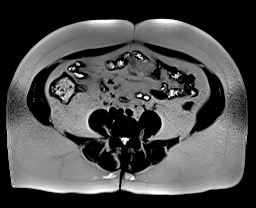

[Series 27: T1 dynamic · axial · 3.8mm · 1.38mm/px · z∈[-112,+218]mm · 3 of 88 slices shown (6 of 6)]
[im 1/88]
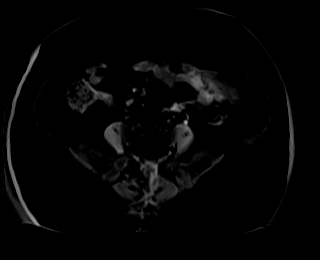
[im 44/88]
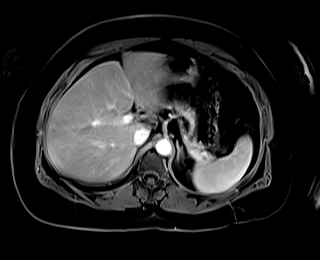
[im 88/88]
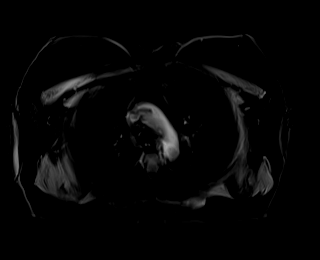

[Series 102: sub_20 sec · axial · 3.8mm · 1.38mm/px · z∈[-112,+218]mm · 3 of 88 slices shown]
[im 1/88]
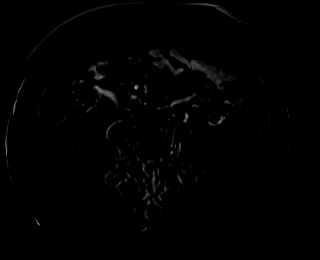
[im 44/88]
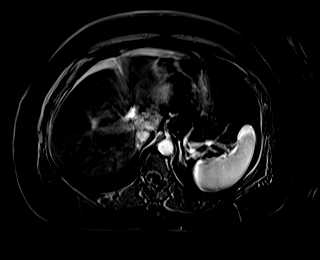
[im 88/88]
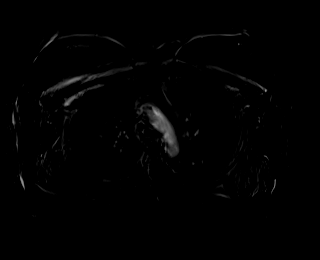

[Series 103: sub_45 sec · axial · 3.8mm · 1.38mm/px · z∈[-112,+218]mm · 3 of 88 slices shown]
[im 1/88]
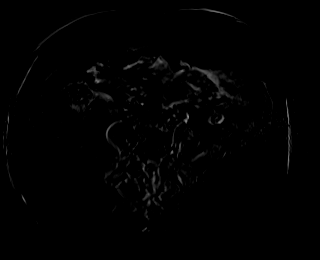
[im 44/88]
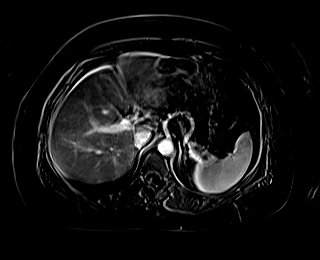
[im 88/88]
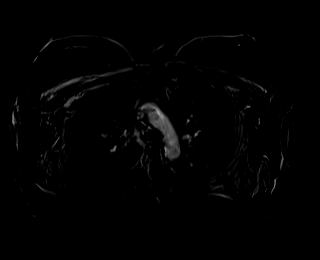

[Series 104: sub_90 sec · axial · 3.8mm · 1.38mm/px · z∈[-112,+218]mm · 3 of 88 slices shown]
[im 1/88]
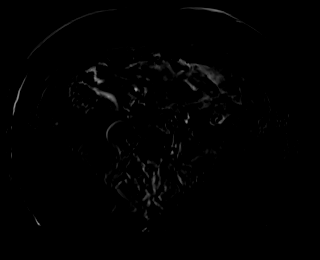
[im 44/88]
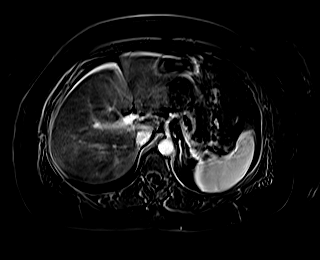
[im 88/88]
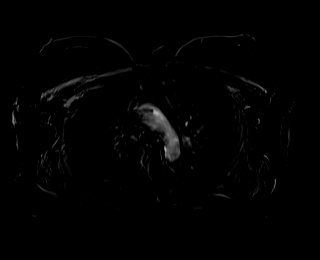

[Series 105: sub_delay · axial · 3.8mm · 1.38mm/px · z∈[-90,+218]mm · 3 of 82 slices shown]
[im 1/82]
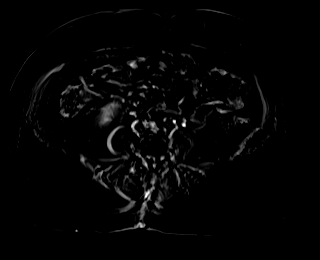
[im 41/82]
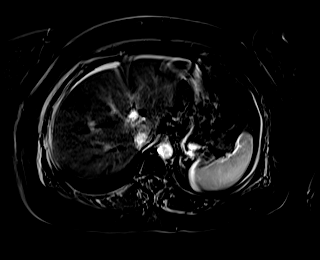
[im 82/82]
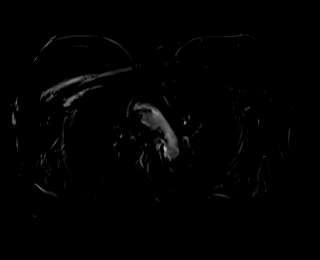

[45 of 48 positions shown; findings below may reference images not displayed]

This
describes a 13 x 12 mm cystic lesion in the uncinate process of the
pancreas in the 5 mm cystic lesion in the neck of the pancreas.
FINDINGS: Lower chest: Portions of exam are mildly motion degraded. Normal
heart size without pericardial or pleural effusion.

Hepatobiliary: Mild hepatic steatosis. No suspicious liver lesion.
Normal gallbladder, without biliary ductal dilatation.

Pancreas: Tiny cystic lesion within the pancreatic neck at 3-4 mm on
16/25.

Uncinate process multi septated cystic lesion measures 1.2 x 1.4 cm
on [DATE]. 1.6 cm craniocaudal on coronal image 13. No suspicious
postcontrast characteristics. No communication with the main
pancreatic duct. No duct dilatation or acute inflammation.

Spleen:  Normal in size, without focal abnormality.

Adrenals/Urinary Tract: Normal adrenal glands. Normal kidneys,
without hydronephrosis.

Stomach/Bowel: Normal stomach and abdominal bowel loops.

Vascular/Lymphatic: Aortic atherosclerosis. No retroperitoneal or
retrocrural adenopathy.

Other:  No ascites.

Musculoskeletal: No acute osseous abnormality.
IMPRESSION: 1. 14 mm multi septated cystic lesion within the pancreatic uncinate
process. Per comparison with the outside MRI report of [DATE],
relatively similar in size. Differential considerations include
pseudocyst or indolent cystic neoplasm (morphology would be typical
for microcystic serous cystadenoma). Per consensus criteria, 1 year
of stability is required to confirm benignity. Therefore, recommend
follow-up with pre and post contrast abdominal MRI at 6-12 months.
This recommendation follows ACR consensus guidelines: Management of
Incidental Pancreatic Cysts: A White Paper of the ACR Incidental
Findings Committee. [HOSPITAL] [UJ];[DATE].
2. Mild motion degradation.
3. Mild hepatic steatosis.
4. The smaller cystic lesion the pancreatic neck is of doubtful
clinical significance and can be re-evaluated on follow-up.
5.  Aortic Atherosclerosis ([UJ]-[UJ]).

## 2020-04-14 MED ORDER — GADOBUTROL 1 MMOL/ML IV SOLN
10.0000 mL | Freq: Once | INTRAVENOUS | Status: AC | PRN
  Administered 2020-04-14: 10 mL via INTRAVENOUS

## 2020-04-14 NOTE — Telephone Encounter (Signed)
Both Wellbutrin and Abilify can cause increased anxiety but not sure why it would increase now when she's been on those same doses for awhile. Let's decrease Wellbutrin XL to 150 mg. If anxiety isn't better after a few weeks, call.

## 2020-04-14 NOTE — Telephone Encounter (Signed)
Patient called and is on Vybrid but is having more increasing anxiety. She also is taking Wellbutrin and Ariprazole and not sure if one of these is causing her symptoms. Please call her at [phone].

## 2020-04-14 NOTE — Telephone Encounter (Signed)
Please review

## 2020-04-20 ENCOUNTER — Other Ambulatory Visit: Payer: Self-pay | Admitting: Physician Assistant

## 2020-06-20 ENCOUNTER — Other Ambulatory Visit: Payer: Self-pay | Admitting: Physician Assistant

## 2020-07-07 ENCOUNTER — Telehealth: Payer: Self-pay

## 2020-07-07 NOTE — Telephone Encounter (Signed)
Prior Authorization submitted and approved for VIIBRYD 40 MG effective 07/07/2020-05/07/2098 with [place]

## 2020-07-22 ENCOUNTER — Other Ambulatory Visit: Payer: Self-pay | Admitting: Physician Assistant

## 2020-07-22 NOTE — Telephone Encounter (Signed)
Controlled substance 

## 2020-08-18 ENCOUNTER — Ambulatory Visit: Payer: 59 | Admitting: Physician Assistant

## 2020-08-23 ENCOUNTER — Other Ambulatory Visit: Payer: Self-pay | Admitting: Physician Assistant

## 2020-08-23 NOTE — Telephone Encounter (Signed)
controlled substance

## 2020-09-28 ENCOUNTER — Other Ambulatory Visit: Payer: Self-pay

## 2020-09-28 DIAGNOSIS — K862 Cyst of pancreas: Secondary | ICD-10-CM

## 2020-10-12 ENCOUNTER — Ambulatory Visit (HOSPITAL_COMMUNITY): Payer: Managed Care, Other (non HMO)

## 2020-10-20 ENCOUNTER — Other Ambulatory Visit: Payer: Self-pay | Admitting: Physician Assistant

## 2020-10-23 ENCOUNTER — Other Ambulatory Visit: Payer: Self-pay | Admitting: Physician Assistant

## 2020-10-25 ENCOUNTER — Ambulatory Visit: Payer: 59 | Admitting: Physician Assistant

## 2020-10-25 NOTE — Telephone Encounter (Signed)
Last filled 5/18

## 2020-10-26 ENCOUNTER — Ambulatory Visit (HOSPITAL_COMMUNITY): Payer: Managed Care, Other (non HMO)

## 2020-10-27 ENCOUNTER — Other Ambulatory Visit: Payer: Self-pay

## 2020-10-27 ENCOUNTER — Ambulatory Visit
Admission: RE | Admit: 2020-10-27 | Discharge: 2020-10-27 | Disposition: A | Discharge status: Home / Self Care | Payer: Managed Care, Other (non HMO) | Source: Ambulatory Visit | Attending: Physician Assistant | Admitting: Physician Assistant

## 2020-10-27 DIAGNOSIS — K862 Cyst of pancreas: Secondary | ICD-10-CM

## 2020-10-27 IMAGING — MR MR ABDOMEN WO/W CM
11 of 19 series · 25 of 48 positions shown · IV contrast (multihance)
Comparison: MRI [DATE]

CLINICAL DATA: Follow-up pancreatic cystic lesion.

EXAM:
MRI ABDOMEN WITHOUT AND WITH CONTRAST
TECHNIQUE: Multiplanar multisequence MR imaging of the abdomen was performed
both before and after the administration of intravenous contrast.
CONTRAST:  20mL MULTIHANCE GADOBENATE DIMEGLUMINE 529 MG/ML IV SOLN

[Series 3: cor haste · coronal · 5.0mm · 0.70mm/px · 2 of 37 slices shown]
[im 1/37]
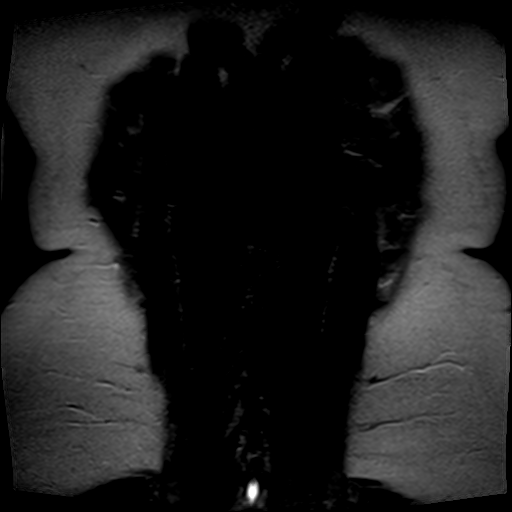
[im 37/37]
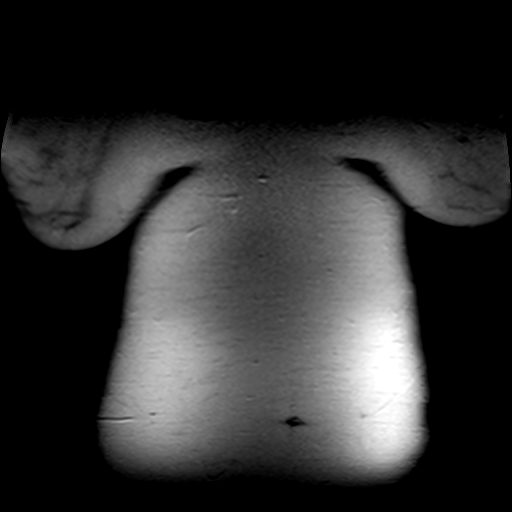

[Series 4: axial haste · axial · 6.0mm · 0.70mm/px · z∈[-87,+144]mm · 2 of 36 slices shown]
[im 1/36]
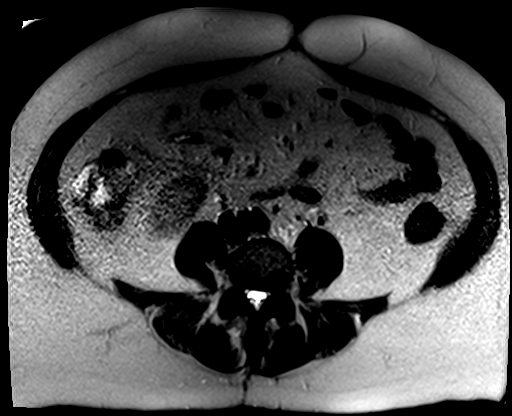
[im 36/36]
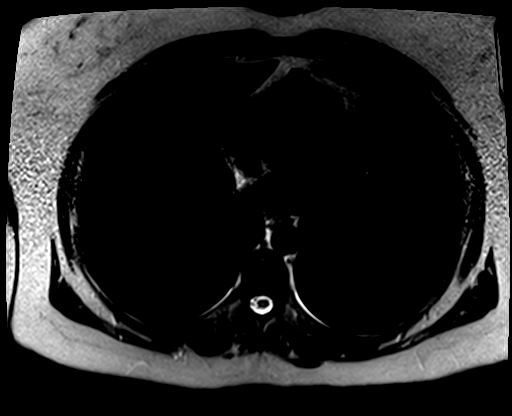

[Series 5: T1 · axial · 6.0mm · 0.70mm/px · z∈[-87,+144]mm · 4 of 72 slices shown]
[im 1/72]
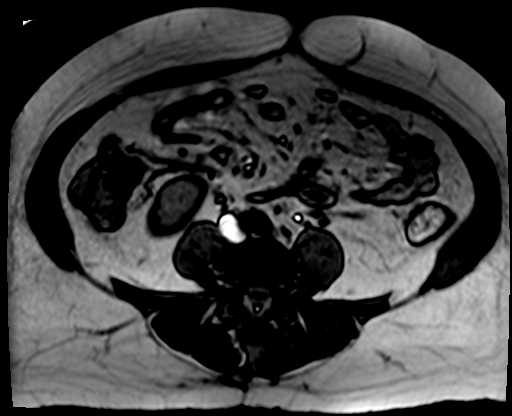
[im 24/72]
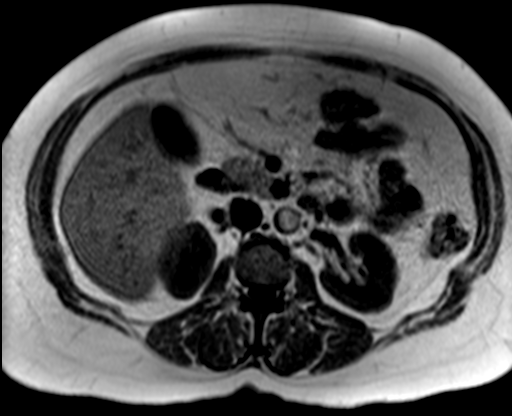
[im 48/72]
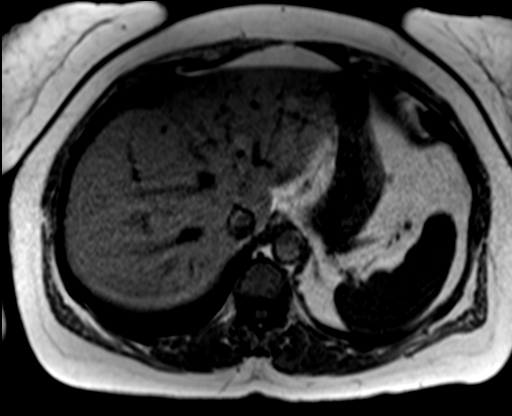
[im 72/72]
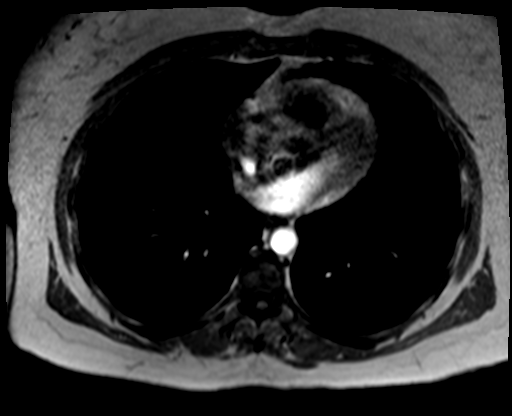

[Series 6: T2 · coronal · 3.0mm · 0.70mm/px · 2 of 48 slices shown]
[im 1/48]
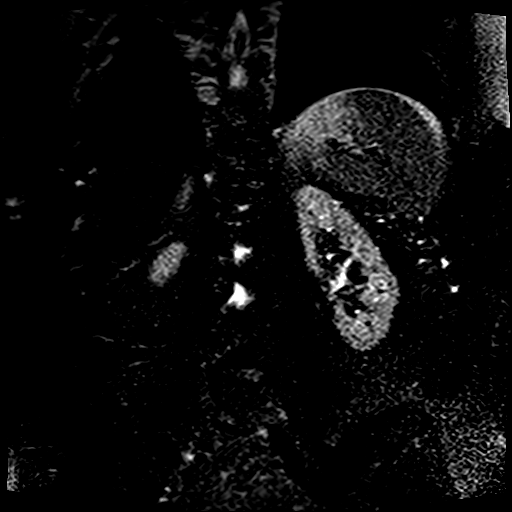
[im 48/48]
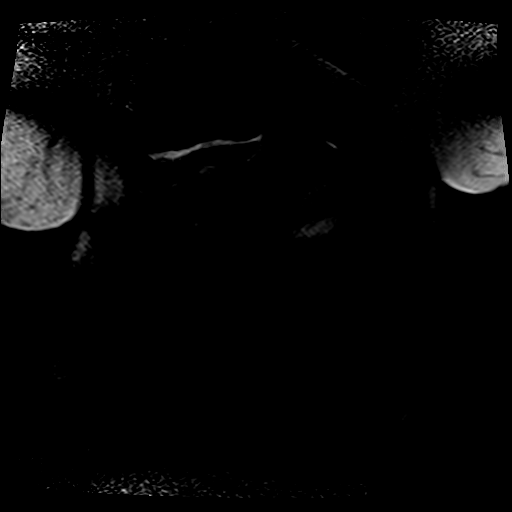

[Series 7: T2 fat-sat · axial · 6.0mm · 1.12mm/px · 1 of 37 slices shown]
[im 1/37]
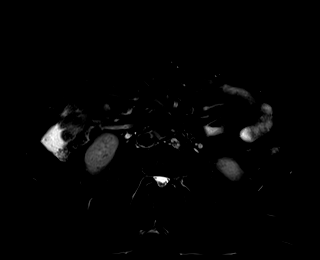

[Series 8: ep2d_[id number]_[id number]_trig · axial · 6.0mm · 1.88mm/px · z∈[-39,+171]mm · 3 of 99 slices shown]
[im 1/99]
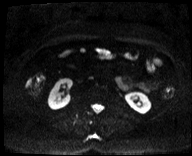
[im 50/99]
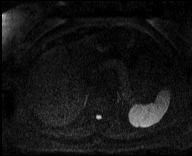
[im 99/99]
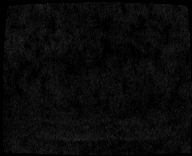

[Series 9: [id number]_trig_adc · axial · 6.0mm · 1.88mm/px · 1 of 33 slices shown]
[im 1/33]
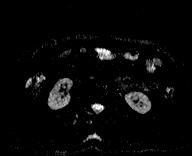

[Series 14: T1 dynamic · axial · non-contrast · 2.5mm · 0.70mm/px · z∈[-90,+148]mm · 3 of 96 slices shown]
[im 1/96]
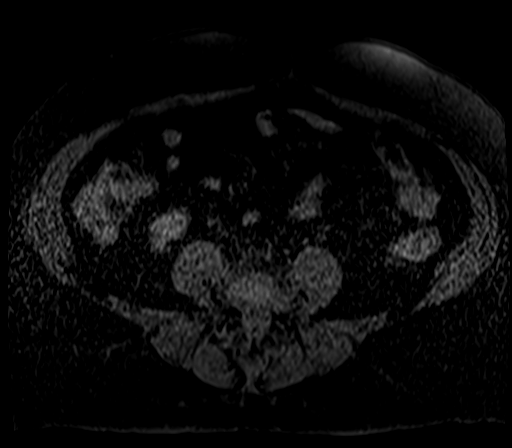
[im 48/96]
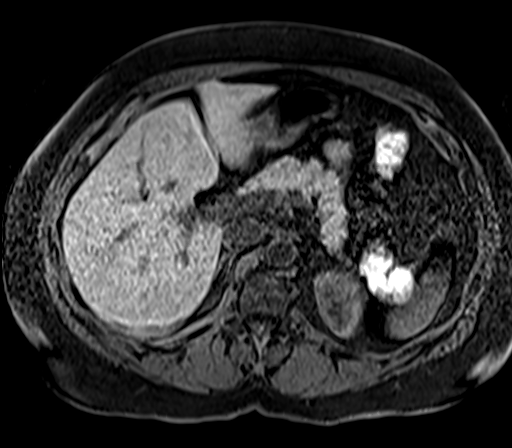
[im 96/96]
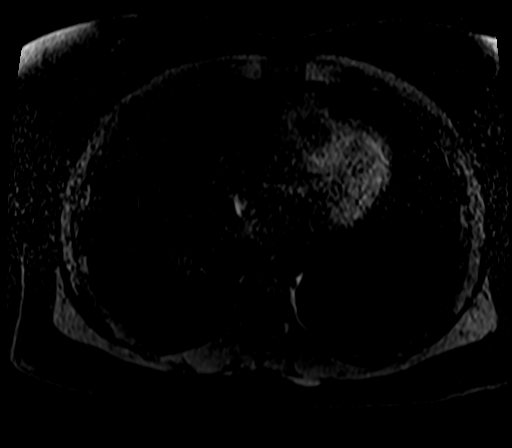

[Series 15: T1 dynamic post-contrast · axial · 2.5mm · 0.70mm/px · z∈[-90,+148]mm · 3 of 96 slices shown (1 of 3)]
[im 1/96]
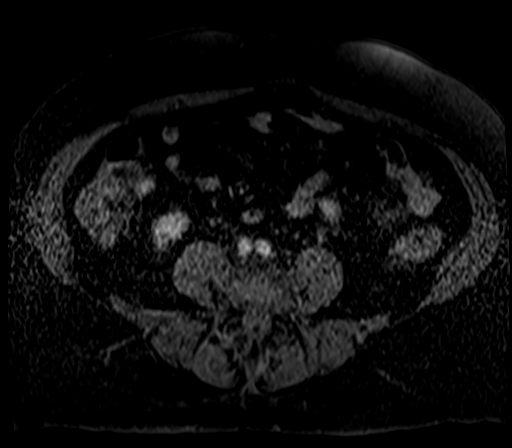
[im 48/96]
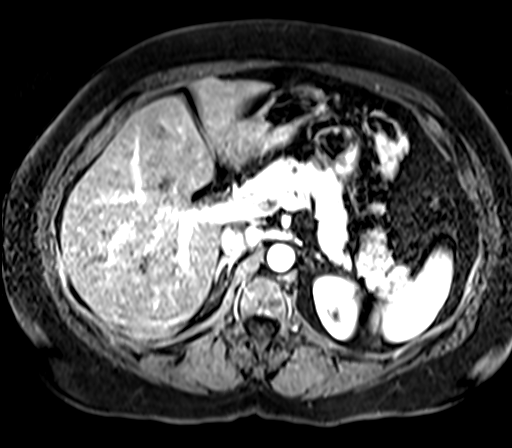
[im 96/96]
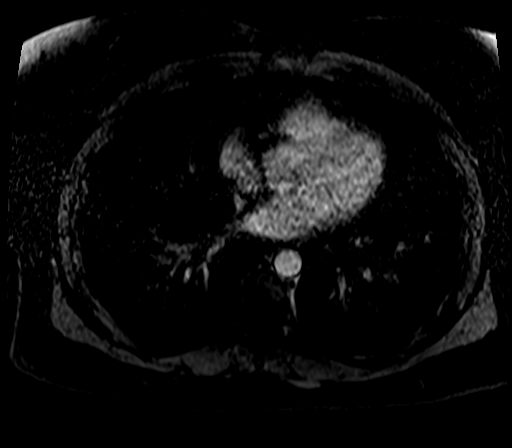

[Series 16: T1 dynamic post-contrast · axial · 2.5mm · 0.70mm/px · z∈[-90,+148]mm · 3 of 96 slices shown (2 of 3)]
[im 1/96]
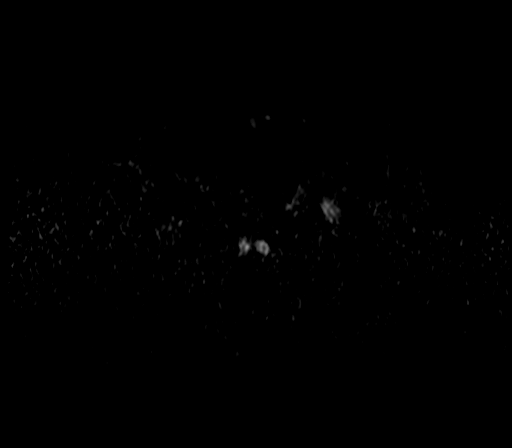
[im 48/96]
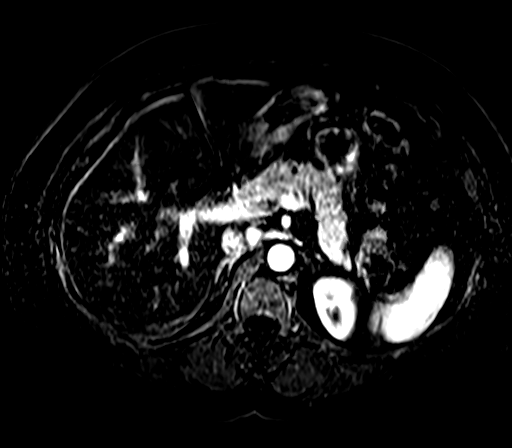
[im 96/96]
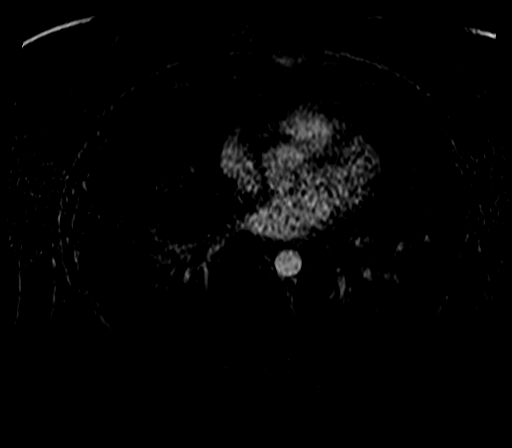

[Series 17: T1 dynamic post-contrast · axial · 2.5mm · 0.70mm/px · 1 of 96 slices shown (3 of 3)]
[im 1/96]
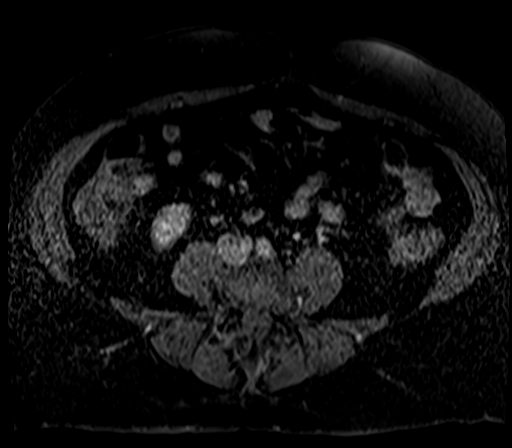

[25 of 48 positions shown; findings below may reference images not displayed]

FINDINGS: Lower chest: No acute abnormality.

Hepatobiliary: Mild loss of signal on out of phase imaging
consistent with hepatic steatosis. No suspicious hepatic lesion.
Gallbladder is unremarkable. No biliary ductal dilation.

Pancreas: No pancreatic ductal dilation. Stable size of the tiny 3-4
mm cystic lesion in the pancreatic neck on image [DATE].

Stable size of the lobulated cystic lesion in the uncinate process
of the pancreas measuring 1.4 x 1.2 cm on image [DATE], unchanged and
1.6 cm in craniocaudal dimension also unchanged. No cystic lesion
has thin septations extending from the lobulations and a mildly
thickened T2 hyperintense central scar, with the septations and scar
demonstrating postcontrast enhancement. No suspicious nodular
enhancing soft tissue foci. No evidence of ductal communication.

Spleen:  Within normal limits.

Adrenals/Urinary Tract: No masses identified. No evidence of
hydronephrosis.

Stomach/Bowel: Visualized portions within the abdomen are
unremarkable.

Vascular/Lymphatic: No pathologically enlarged lymph nodes
identified. No abdominal aortic aneurysm demonstrated.

Other:  No abdominal ascites.

Musculoskeletal: No suspicious bone lesions identified.
IMPRESSION: 1. Stable size of the lobulated cystic lesion in the uncinate
process of the pancreas with imaging features most consistent with a
microcystic pancreatic serous cystadenoma. In an asymptomatic
patient with 1 year of imaging stability this is considered a benign
lesion and no further follow-up imaging is required. This
recommendation follows ACR consensus guidelines: Management of
incidental Pancreatic Cysts: A White Paper of the ACR Incidental
findings Committee. [HOSPITAL] [3Q];[DATE].
2. Stable tiny 3-4 mm cystic lesion in the pancreatic neck, no
definite ductal communication, no pancreatic ductal dilation and no
suspicious enhancing features. Likely representing a side branch
IPMN. Recommend follow up pre and post contrast MRI/MRCP in 2
years. This recommendation follows ACR consensus guidelines:
Management of Incidental Pancreatic Cysts: A White Paper of the ACR
Incidental Findings Committee. [HOSPITAL] [3Q];[DATE].
3. Mild hepatic steatosis.

## 2020-10-27 MED ORDER — GADOBENATE DIMEGLUMINE 529 MG/ML IV SOLN
20.0000 mL | Freq: Once | INTRAVENOUS | Status: AC | PRN
  Administered 2020-10-27: 20 mL via INTRAVENOUS

## 2020-11-09 ENCOUNTER — Other Ambulatory Visit: Payer: Self-pay | Admitting: Physician Assistant

## 2020-11-12 ENCOUNTER — Other Ambulatory Visit: Payer: Self-pay | Admitting: Physician Assistant

## 2020-11-17 ENCOUNTER — Other Ambulatory Visit: Payer: Self-pay | Admitting: Physician Assistant

## 2020-11-24 ENCOUNTER — Ambulatory Visit (INDEPENDENT_AMBULATORY_CARE_PROVIDER_SITE_OTHER): Payer: 59 | Admitting: Physician Assistant

## 2020-11-24 ENCOUNTER — Other Ambulatory Visit: Payer: Self-pay

## 2020-11-24 ENCOUNTER — Encounter: Payer: Self-pay | Admitting: Physician Assistant

## 2020-11-24 DIAGNOSIS — F4321 Adjustment disorder with depressed mood: Secondary | ICD-10-CM | POA: Diagnosis not present

## 2020-11-24 DIAGNOSIS — G4700 Insomnia, unspecified: Secondary | ICD-10-CM

## 2020-11-24 DIAGNOSIS — F331 Major depressive disorder, recurrent, moderate: Secondary | ICD-10-CM | POA: Diagnosis not present

## 2020-11-24 DIAGNOSIS — F411 Generalized anxiety disorder: Secondary | ICD-10-CM | POA: Diagnosis not present

## 2020-11-24 MED ORDER — VILAZODONE HCL 40 MG PO TABS: 40.0000 mg | ORAL_TABLET | Freq: Every day | ORAL | 1 refills | Status: DC

## 2020-11-24 MED ORDER — ARIPIPRAZOLE 5 MG PO TABS
5.0000 mg | ORAL_TABLET | Freq: Every day | ORAL | 1 refills | Status: DC
Start: 2020-11-24 — End: 2020-12-17

## 2020-11-24 NOTE — Progress Notes (Signed)
[facility] Check  Patient ID: Whitney Huynh,  MRN: [id number]  PCP: Whitney Rough, PA-C  Date of Evaluation: 11/24/2020 Time spent:30 minutes  Chief Complaint:  Chief Complaint   Anxiety; Depression     HISTORY/CURRENT STATUS: HPI for routine med check.  Her mom died suddenly in 10-03-22.  She was sent to the ER after a fall, did not hit her head or seem to have any fractures, but after she was in the ER it was felt she had pneumonia and her oxygen level dropped suddenly and she died.   Whitney Huynh has been very sad, understandably.  She is trying to keep herself busy so she does not have a lot of time to dwell on sadness.  Does not really want to do anything.  Energy and motivation are low.  Lethargic.  She does isolate except for work.  ADLs are normal.  Denies suicidal or homicidal thoughts.  She does have a lot of anxiety now more so than she did.  She is afraid something bad is going to happen to her friends or family.  When someone is taking a trip she is afraid they will have a car wreck and be seriously injured or killed.  She does not have her reason to think that way she says.  Thinks it may makes more sense if her mom had died in an MVA.  She does have some panic attacks but the anxiety is more generalized.  Just a sense of dread and unease almost all the time.  She will have ruminating thoughts especially in the evening, making it hard for her to go to sleep and get adequate rest.  Patient denies increased energy with decreased need for sleep, no increased talkativeness, no racing thoughts, no impulsivity or risky behaviors, no increased spending, no increased libido, no grandiosity, no increased irritability or anger, and no hallucinations.  Denies dizziness, syncope, seizures, numbness, tingling, tremor, tics, unsteady gait, slurred speech, confusion. Denies unexplained weight loss, frequent infections, or sores that heal slowly.  No polyphagia, polydipsia, or polyuria. Denies  visual changes or paresthesias.   Individual Medical History/ Review of Systems: Changes? :Yes   Had L spine surgery last Oct and is feeling much better physically.    Past medications for mental health diagnoses include: Zoloft, Prozac, Lexapro, Wellbutrin, Viibryd, Cymbalta, Ativan, Abilify  Allergies: Morphine and related   Current Medications:  Current Outpatient Medications:    ARIPiprazole (ABILIFY) 5 MG tablet, Take 1 tablet (5 mg total) by mouth daily., Disp: 30 tablet, Rfl: 1   buPROPion (WELLBUTRIN XL) 300 MG 24 hr tablet, TAKE 1 TABLET BY MOUTH EVERY DAY, Disp: 90 tablet, Rfl: 0   levothyroxine (SYNTHROID) 150 MCG tablet, Take 175 mcg by mouth daily. Name Brand, Disp: , Rfl:    LORazepam (ATIVAN) 1 MG tablet, TAKE 1 TABLET BY MOUTH EVERY 8 HOURS AS NEEDED FOR ANXIETY, Disp: 90 tablet, Rfl: 1   Vilazodone HCl (VIIBRYD) 40 MG TABS, Take 1 tablet (40 mg total) by mouth daily., Disp: 90 tablet, Rfl: 1 Medication Side Effects: none  Family Medical/ Social History: Changes? Mom died in 10-03-2022, suddenly, after a fall, didn't hit her head or anything, but they think she may have had pneumonia.   MENTAL HEALTH EXAM:  Last menstrual period 02/17/2013.There is no height or weight on file to calculate BMI.  General Appearance: Casual, Neat, Well Groomed, and Obese  Eye Contact:  Good  Speech:  Clear and Coherent and Normal Rate  Volume:  Normal  Mood:  Depressed  Affect:  Depressed, Tearful, and Anxious  Thought Process:  Goal Directed and Descriptions of Associations: Intact  Orientation:  Full (Time, Place, and Person)  Thought Content: Logical   Suicidal Thoughts:  No  Homicidal Thoughts:  No  Memory:  WNL  Judgement:  Good  Insight:  Good  Psychomotor Activity:  Normal  Concentration:  Concentration: Good  Recall:  Good  Fund of Knowledge: Good  Language: Good  Assets:  Desire for Improvement  ADL's:  Intact  Cognition: WNL  Prognosis:  Good     DIAGNOSES:     ICD-10-CM   1. Major depressive disorder, recurrent episode, moderate (HCC)  F33.1     2. Generalized anxiety disorder  F41.1     3. Insomnia, unspecified type  G47.00     4. Grief  F43.21        Receiving Psychotherapy: No    RECOMMENDATIONS:  PDMP reviewed.  Last Ativan filled 10/25/2020 I provided 30 minutes of face to face time during this encounter, including time spent before and after the visit in records review, medical decision making, and charting.  My condolences for the loss of her mom.  We discussed the grief, the stages of it and different ways to cope. Recommend she contact hospice for grief counseling.  Also recommend Grief Share offered usually through churches.  She already knows of a group that will be starting soon.  Encouraged her to take the Ativan more often if needed. We discussed increasing the Wellbutrin but we have tried that and it did not help but made her feel jittery. Also recommend increasing the Abilify.  She would like to try it. Increase Abilify 2 mg that she already has on hand to 2 pills until that runs out.  And then after that start Abilify 5 mg p.o. every morning.   Continue Wellbutrin XL 300 mg p.o. daily. Continue Ativan 1 mg, 1/2-1 p.o. 3 times daily as needed.  Continue Viibryd 40 mg, 1 p.o. daily. Need to discuss labs at next visit. Return in 6 to 8 weeks.  Donnal Moat, PA-C

## 2020-12-16 ENCOUNTER — Other Ambulatory Visit: Payer: Self-pay | Admitting: Physician Assistant

## 2020-12-19 ENCOUNTER — Other Ambulatory Visit: Payer: Self-pay | Admitting: Physician Assistant

## 2020-12-26 ENCOUNTER — Other Ambulatory Visit: Payer: Self-pay | Admitting: Physician Assistant

## 2020-12-28 NOTE — Telephone Encounter (Signed)
Last filled 7/19

## 2021-01-25 ENCOUNTER — Ambulatory Visit (INDEPENDENT_AMBULATORY_CARE_PROVIDER_SITE_OTHER): Payer: 59 | Admitting: Physician Assistant

## 2021-01-25 ENCOUNTER — Encounter: Payer: Self-pay | Admitting: Physician Assistant

## 2021-01-25 ENCOUNTER — Other Ambulatory Visit: Payer: Self-pay

## 2021-01-25 DIAGNOSIS — F3341 Major depressive disorder, recurrent, in partial remission: Secondary | ICD-10-CM

## 2021-01-25 DIAGNOSIS — F411 Generalized anxiety disorder: Secondary | ICD-10-CM | POA: Diagnosis not present

## 2021-01-25 DIAGNOSIS — F4321 Adjustment disorder with depressed mood: Secondary | ICD-10-CM | POA: Diagnosis not present

## 2021-01-25 DIAGNOSIS — Z6379 Other stressful life events affecting family and household: Secondary | ICD-10-CM | POA: Diagnosis not present

## 2021-01-25 MED ORDER — LORAZEPAM 1 MG PO TABS: 0.5000 mg | ORAL_TABLET | Freq: Three times a day (TID) | ORAL | 5 refills | Status: DC | PRN

## 2021-01-25 NOTE — Progress Notes (Signed)
[facility]  Patient ID: CORRENE LALANI,  MRN: [id number]  PCP: Nicholes Rough, PA-C  Date of Evaluation: 01/25/2021 Time spent:20 minutes  Chief Complaint:  Chief Complaint   Anxiety; Depression; Follow-up     HISTORY/CURRENT STATUS: HPI for routine med check.  At the last visit we increased the Abilify.  It has helped with motivation and energy.  She still feels very sad of course with the loss of her mom earlier this year.  Feels that medications are working as well as they can.  Is able to enjoy things at times.  Energy and motivation are good.  Personal hygiene is normal.  Work is going fine.  She is not isolating and does not cry easily unless she is thinking about her mom.  No suicidal or homicidal thoughts.  She does get anxious and the Ativan does help, but always takes it at night to help her relax and go to sleep.  If she takes it during the daytime it makes her drowsy but not to the point that she cannot function.  She is not really having panic attacks but more of a generalized sense of unease, grief, and sadness and the Ativan helps that too.  He wonders if anything else would help more, her husband takes Klonopin and she is just asking.  Patient denies increased energy with decreased need for sleep, no increased talkativeness, no racing thoughts, no impulsivity or risky behaviors, no increased spending, no increased libido, no grandiosity, no increased irritability or anger, and no hallucinations.  Denies dizziness, syncope, seizures, numbness, tingling, tremor, tics, unsteady gait, slurred speech, confusion. Denies unexplained weight loss, frequent infections, or sores that heal slowly.  No polyphagia, polydipsia, or polyuria. Denies visual changes or paresthesias.   Individual Medical History/ Review of Systems: Changes? :No      Past medications for mental health diagnoses include: Zoloft, Prozac, Lexapro, Wellbutrin, Viibryd, Cymbalta, Ativan,  Abilify  Allergies: Morphine and related   Current Medications:  Current Outpatient Medications:    ARIPiprazole (ABILIFY) 5 MG tablet, TAKE 1 TABLET (5 MG TOTAL) BY MOUTH DAILY., Disp: 90 tablet, Rfl: 0   buPROPion (WELLBUTRIN XL) 300 MG 24 hr tablet, TAKE 1 TABLET BY MOUTH EVERY DAY, Disp: 90 tablet, Rfl: 0   levothyroxine (SYNTHROID) 150 MCG tablet, Take 175 mcg by mouth daily. Name Brand, Disp: , Rfl:    Vilazodone HCl (VIIBRYD) 40 MG TABS, Take 1 tablet (40 mg total) by mouth daily., Disp: 90 tablet, Rfl: 1   LORazepam (ATIVAN) 1 MG tablet, Take 0.5-1 tablets (0.5-1 mg total) by mouth every 8 (eight) hours as needed., Disp: 90 tablet, Rfl: 5 Medication Side Effects: none  Family Medical/ Social History: Changes?  Stress in family, confidential  MENTAL HEALTH EXAM:  Last menstrual period 02/17/2013.There is no height or weight on file to calculate BMI.  General Appearance: Casual, Neat, Well Groomed, and Obese  Eye Contact:  Good  Speech:  Clear and Coherent and Normal Rate  Volume:  Normal  Mood:   sad  Affect:  Tearful  Thought Process:  Goal Directed and Descriptions of Associations: Intact  Orientation:  Full (Time, Place, and Person)  Thought Content: Logical   Suicidal Thoughts:  No  Homicidal Thoughts:  No  Memory:  WNL  Judgement:  Good  Insight:  Good  Psychomotor Activity:  Normal  Concentration:  Concentration: Good  Recall:  Good  Fund of Knowledge: Good  Language: Good  Assets:  Desire for Improvement  ADL's:  Intact  Cognition: WNL  Prognosis:  Good     DIAGNOSES:    ICD-10-CM   1. Recurrent major depressive disorder, in partial remission ([place])  F33.41     2. Generalized anxiety disorder  F41.1     3. Grief  F43.21     4. Stressful life events affecting family and household  Z63.79       Receiving Psychotherapy: No    RECOMMENDATIONS:  PDMP reviewed.  Last Ativan filled 12/28/2020 I provided 20 minutes of face to face time during this  encounter, including time spent before and after the visit in records review, medical decision making, and charting.  She is doing well as far as medications are concerned so no changes are necessary. We discussed other options for the anxiety, including Xanax and Klonopin.  Xanax and Klonopin usually cause more drowsiness than the Ativan, we can try it if she would like but I feel like if she takes the Ativan more often if it is not affecting her work or daily life, it would be best to stay on that.  With the Klonopin it does not work as quickly as Ativan or Xanax but it lasts longer.  For now we will stay on Ativan. Continue Abilify 5 mg p.o. every morning.   Continue Wellbutrin XL 300 mg p.o. daily. Continue Ativan 1 mg, 1/2-1 p.o. 3 times daily as needed.   Continue Viibryd 40 mg, 1 p.o. daily. Recommend counseling. Return in 3 months.  Donnal Moat, PA-C

## 2021-01-26 ENCOUNTER — Encounter: Payer: Self-pay | Admitting: Physician Assistant

## 2021-03-15 ENCOUNTER — Other Ambulatory Visit: Payer: Self-pay | Admitting: Physician Assistant

## 2021-04-10 ENCOUNTER — Other Ambulatory Visit: Payer: Self-pay | Admitting: Physician Assistant

## 2021-04-25 ENCOUNTER — Ambulatory Visit: Payer: 59 | Admitting: Physician Assistant

## 2021-04-27 ENCOUNTER — Ambulatory Visit (INDEPENDENT_AMBULATORY_CARE_PROVIDER_SITE_OTHER): Payer: 59 | Admitting: Physician Assistant

## 2021-04-27 ENCOUNTER — Other Ambulatory Visit: Payer: Self-pay

## 2021-04-27 ENCOUNTER — Encounter: Payer: Self-pay | Admitting: Physician Assistant

## 2021-04-27 DIAGNOSIS — F411 Generalized anxiety disorder: Secondary | ICD-10-CM | POA: Diagnosis not present

## 2021-04-27 DIAGNOSIS — Z6379 Other stressful life events affecting family and household: Secondary | ICD-10-CM | POA: Diagnosis not present

## 2021-04-27 DIAGNOSIS — F3341 Major depressive disorder, recurrent, in partial remission: Secondary | ICD-10-CM | POA: Diagnosis not present

## 2021-04-27 MED ORDER — BUPROPION HCL ER (XL) 300 MG PO TB24: 300.0000 mg | ORAL_TABLET | Freq: Every day | ORAL | 3 refills | Status: DC

## 2021-04-27 MED ORDER — VILAZODONE HCL 40 MG PO TABS: 40.0000 mg | ORAL_TABLET | Freq: Every day | ORAL | 3 refills | Status: DC

## 2021-04-27 NOTE — Progress Notes (Signed)
[facility]  Patient ID: Whitney Huynh,  MRN: [id number]  PCP: Nicholes Rough, PA-C  Date of Evaluation: 04/27/2021 Time spent:20 minutes  Chief Complaint:  Chief Complaint   Anxiety; Depression; Follow-up     HISTORY/CURRENT STATUS: HPI for routine med check.  Doing just ok.  Still under a lot of stress with personal family issues, her son with marital problems.  Feels like her medications are working as well as they can though.  She does not have time to enjoy anything.  Work is going okay.  She recently got a promotion.  Energy and motivation are fair to good depending on the day.  ADLs are normal.  Personal hygiene is normal.  Appetite and weight are stable.  No suicidal or homicidal thoughts.    Anxiety is still an issue.  The Ativan definitely helps.  She is just under so much stress she really needs it right now.  She sleeps good most of the time.  Patient denies increased energy with decreased need for sleep, no increased talkativeness, no racing thoughts, no impulsivity or risky behaviors, no increased spending, no increased libido, no grandiosity, no increased irritability or anger, and no hallucinations.  Denies dizziness, syncope, seizures, numbness, tingling, tremor, tics, unsteady gait, slurred speech, confusion. Denies unexplained weight loss, frequent infections, or sores that heal slowly.  No polyphagia, polydipsia, or polyuria. Denies visual changes or paresthesias.   Individual Medical History/ Review of Systems: Changes? :No      Past medications for mental health diagnoses include: Zoloft, Prozac, Lexapro, Wellbutrin, Viibryd, Cymbalta, Ativan, Abilify  Allergies: Morphine and related   Current Medications:  Current Outpatient Medications:    ARIPiprazole (ABILIFY) 5 MG tablet, TAKE 1 TABLET (5 MG TOTAL) BY MOUTH DAILY., Disp: 90 tablet, Rfl: 0   levothyroxine (SYNTHROID) 150 MCG tablet, Take 175 mcg by mouth daily. Name Brand, Disp: , Rfl:     LORazepam (ATIVAN) 1 MG tablet, Take 0.5-1 tablets (0.5-1 mg total) by mouth every 8 (eight) hours as needed., Disp: 90 tablet, Rfl: 5   buPROPion (WELLBUTRIN XL) 300 MG 24 hr tablet, Take 1 tablet (300 mg total) by mouth daily., Disp: 90 tablet, Rfl: 3   Vilazodone HCl (VIIBRYD) 40 MG TABS, Take 1 tablet (40 mg total) by mouth daily., Disp: 90 tablet, Rfl: 3 Medication Side Effects: none  Family Medical/ Social History: Changes?  Stress in family, confidential  MENTAL HEALTH EXAM:  Last menstrual period 02/17/2013.There is no height or weight on file to calculate BMI.  General Appearance: Casual, Neat, Well Groomed, and Obese  Eye Contact:  Good  Speech:  Clear and Coherent and Normal Rate  Volume:  Normal  Mood:   sad  Affect:  Tearful  Thought Process:  Goal Directed and Descriptions of Associations: Intact  Orientation:  Full (Time, Place, and Person)  Thought Content: Logical   Suicidal Thoughts:  No  Homicidal Thoughts:  No  Memory:  WNL  Judgement:  Good  Insight:  Good  Psychomotor Activity:  Normal  Concentration:  Concentration: Good  Recall:  Good  Fund of Knowledge: Good  Language: Good  Assets:  Desire for Improvement  ADL's:  Intact  Cognition: WNL  Prognosis:  Good     DIAGNOSES:    ICD-10-CM   1. Recurrent major depressive disorder, in partial remission ([place])  F33.41     2. Generalized anxiety disorder  F41.1     3. Stressful life events affecting family and household  Z63.79  Receiving Psychotherapy: No    RECOMMENDATIONS:  PDMP reviewed.  Last Ativan filled 03/27/2021. I provided 20 minutes of face to face time during this encounter, including time spent before and after the visit in records review, medical decision making, counseling pertinent to today's visit, and charting. She is aware that the medications can only help so much, and that a lot of what she is feeling is circumstantial. Continue Abilify 5 mg p.o. every morning.    Continue Wellbutrin XL 300 mg p.o. daily. Continue Ativan 1 mg, 1/2-1 p.o. 3 times daily as needed.   Continue Viibryd 40 mg, 1 p.o. daily. Recommend counseling. Return in 6 months.  Donnal Moat, PA-C

## 2021-05-02 ENCOUNTER — Other Ambulatory Visit: Payer: Self-pay | Admitting: Physician Assistant

## 2021-07-09 ENCOUNTER — Other Ambulatory Visit: Payer: Self-pay | Admitting: Physician Assistant

## 2021-08-12 ENCOUNTER — Other Ambulatory Visit: Payer: Self-pay | Admitting: Physician Assistant

## 2021-09-03 ENCOUNTER — Other Ambulatory Visit: Payer: Self-pay | Admitting: Physician Assistant

## 2021-11-20 ENCOUNTER — Other Ambulatory Visit: Payer: Self-pay | Admitting: Physician Assistant

## 2021-11-21 NOTE — Telephone Encounter (Signed)
Filled 6/11

## 2021-11-21 NOTE — Telephone Encounter (Signed)
Please scheduled appt

## 2021-11-25 NOTE — Telephone Encounter (Signed)
Pt is schedule for 8/8

## 2021-12-13 ENCOUNTER — Encounter: Payer: Self-pay | Admitting: Physician Assistant

## 2021-12-13 ENCOUNTER — Ambulatory Visit (INDEPENDENT_AMBULATORY_CARE_PROVIDER_SITE_OTHER): Payer: BC Managed Care – PPO | Admitting: Physician Assistant

## 2021-12-13 DIAGNOSIS — F3341 Major depressive disorder, recurrent, in partial remission: Secondary | ICD-10-CM

## 2021-12-13 DIAGNOSIS — Z6379 Other stressful life events affecting family and household: Secondary | ICD-10-CM | POA: Diagnosis not present

## 2021-12-13 DIAGNOSIS — F411 Generalized anxiety disorder: Secondary | ICD-10-CM

## 2021-12-13 MED ORDER — LORAZEPAM 1 MG PO TABS
ORAL_TABLET | ORAL | 5 refills | Status: DC
Start: 2021-12-13 — End: 2022-07-05
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
## 2021-12-28 ENCOUNTER — Other Ambulatory Visit: Payer: Self-pay

## 2021-12-28 ENCOUNTER — Telehealth: Payer: Self-pay | Admitting: Physician Assistant

## 2021-12-28 NOTE — Telephone Encounter (Signed)
Pt called 3:33pm. Lorazepam 1. '0mg'$  is out of stock at the [facility], but they do have 0.'5mg'$  available. Can we RX the 0.'5mg'$  with diff dosage ?

## 2021-12-28 NOTE — Telephone Encounter (Signed)
Yes, please cancel the 1 mg and pend the 0.5 mg, 1-2 q8h prn. #[id number]

## 2021-12-28 NOTE — Telephone Encounter (Signed)
Please review

## 2021-12-29 MED ORDER — LORAZEPAM 0.5 MG PO TABS: ORAL_TABLET | ORAL | 0 refills | Status: DC

## 2022-01-29 ENCOUNTER — Other Ambulatory Visit: Payer: Self-pay | Admitting: Physician Assistant

## 2022-01-30 NOTE — Telephone Encounter (Signed)
Filled 8/24

## 2022-02-28 ENCOUNTER — Other Ambulatory Visit: Payer: Self-pay | Admitting: Physician Assistant

## 2022-03-24 ENCOUNTER — Other Ambulatory Visit: Payer: Self-pay | Admitting: Physician Assistant

## 2022-03-26 ENCOUNTER — Other Ambulatory Visit: Payer: Self-pay | Admitting: Physician Assistant

## 2022-05-22 ENCOUNTER — Other Ambulatory Visit: Payer: Self-pay | Admitting: Physician Assistant

## 2022-06-15 ENCOUNTER — Ambulatory Visit: Payer: 59 | Admitting: Physician Assistant

## 2022-07-01 ENCOUNTER — Other Ambulatory Visit: Payer: Self-pay | Admitting: Physician Assistant

## 2022-07-05 ENCOUNTER — Ambulatory Visit: Payer: BC Managed Care – PPO | Admitting: Physician Assistant

## 2022-08-08 ENCOUNTER — Ambulatory Visit (INDEPENDENT_AMBULATORY_CARE_PROVIDER_SITE_OTHER): Payer: BC Managed Care – PPO | Admitting: Physician Assistant

## 2022-08-08 ENCOUNTER — Encounter: Payer: Self-pay | Admitting: Physician Assistant

## 2022-08-08 DIAGNOSIS — F3341 Major depressive disorder, recurrent, in partial remission: Secondary | ICD-10-CM

## 2022-08-08 DIAGNOSIS — G4700 Insomnia, unspecified: Secondary | ICD-10-CM | POA: Diagnosis not present

## 2022-08-08 DIAGNOSIS — F411 Generalized anxiety disorder: Secondary | ICD-10-CM

## 2022-08-08 MED ORDER — ARIPIPRAZOLE 5 MG PO TABS: 5.0000 mg | ORAL_TABLET | Freq: Every day | ORAL | 1 refills | Status: DC

## 2022-08-08 MED ORDER — LORAZEPAM 1 MG PO TABS
1.0000 mg | ORAL_TABLET | Freq: Three times a day (TID) | ORAL | 5 refills | Status: AC | PRN
Start: 2022-08-08 — End: ?

## 2022-08-08 MED ORDER — BUPROPION HCL ER (XL) 300 MG PO TB24: 300.0000 mg | ORAL_TABLET | Freq: Every day | ORAL | 1 refills | Status: DC

## 2022-08-08 MED ORDER — VILAZODONE HCL 40 MG PO TABS: 40.0000 mg | ORAL_TABLET | Freq: Every day | ORAL | 1 refills | Status: DC
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
## 2023-01-23 ENCOUNTER — Other Ambulatory Visit: Payer: Self-pay | Admitting: Urology

## 2023-01-24 ENCOUNTER — Encounter (HOSPITAL_COMMUNITY): Payer: Self-pay

## 2023-01-24 NOTE — Patient Instructions (Addendum)
SURGICAL WAITING ROOM VISITATION Patients having surgery or a procedure may have no more than 2 support people in the waiting area - these visitors may rotate.    Children under the age of 62 must have an adult with them who is not the patient.  If the patient needs to stay at the hospital during part of their recovery, the visitor guidelines for inpatient rooms apply. Pre-op nurse will coordinate an appropriate time for 1 support person to accompany patient in pre-op.  This support person may not rotate.    Please refer to the [facility] website for the visitor guidelines for Inpatients (after your surgery is over and you are in a regular room).       Your procedure is scheduled on: 02-06-23   Report to [facility] Main Entrance    Report to admitting at 6:30 AM   Call this number if you have problems the morning of surgery [phone]   Do not eat food or drink liquids:After Midnight.          If you have questions, please contact your surgeon's office.   FOLLOW  ANY ADDITIONAL PRE OP INSTRUCTIONS YOU RECEIVED FROM YOUR SURGEON'S OFFICE!!!     Oral Hygiene is also important to reduce your risk of infection.                                    Remember - BRUSH YOUR TEETH THE MORNING OF SURGERY WITH YOUR REGULAR TOOTHPASTE   Do NOT smoke after Midnight   Take these medicines the morning of surgery with A SIP OF WATER:    ARIPiprazole  Bupropion  Synthroid  Vilazodone  Lorazepam if needed  Stop all vitamins and herbal supplements 7 days before surgery  Bring CPAP mask and tubing day of surgery.                              You may not have any metal on your body including hair pins, jewelry, and body piercing             Do not wear make-up, lotions, powders, perfumes or deodorant  Do not wear nail polish including gel and S&S, artificial/acrylic nails, or any other type of covering on natural nails including finger and toenails. If you have artificial nails,  gel coating, etc. that needs to be removed by a nail salon please have this removed prior to surgery or surgery may need to be canceled/ delayed if the surgeon/ anesthesia feels like they are unable to be safely monitored.   Do not shave  48 hours prior to surgery.    Do not bring valuables to the hospital. [facility] IS NOT RESPONSIBLE   FOR VALUABLES.   Contacts, dentures or bridgework may not be worn into surgery.  DO NOT BRING YOUR HOME MEDICATIONS TO THE HOSPITAL. PHARMACY WILL DISPENSE MEDICATIONS LISTED ON YOUR MEDICATION LIST TO YOU DURING YOUR ADMISSION IN THE HOSPITAL!    Patients discharged on the day of surgery will not be allowed to drive home.  Someone NEEDS to stay with you for the first 24 hours after anesthesia.   Special Instructions: Bring a copy of your healthcare power of attorney and living will documents the day of surgery if you haven't scanned them before.  Please read over the following fact sheets you were given: IF YOU HAVE QUESTIONS ABOUT YOUR PRE-OP INSTRUCTIONS PLEASE CALL [phone] [facility]  If you received a COVID test during your pre-op visit  it is requested that you wear a mask when out in public, stay away from anyone that may not be feeling well and notify your surgeon if you develop symptoms. If you test positive for Covid or have been in contact with anyone that has tested positive in the last 10 days please notify you surgeon.  [facility] - Preparing for Surgery Before surgery, you can play an important role.  Because skin is not sterile, your skin needs to be as free of germs as possible.  You can reduce the number of germs on your skin by washing with CHG (chlorahexidine gluconate) soap before surgery.  CHG is an antiseptic cleaner which kills germs and bonds with the skin to continue killing germs even after washing. Please DO NOT use if you have an allergy to CHG or antibacterial soaps.  If your skin becomes reddened/irritated stop using  the CHG and inform your nurse when you arrive at Short Stay. Do not shave (including legs and underarms) for at least 48 hours prior to the first CHG shower.  You may shave your face/neck.  Please follow these instructions carefully:  1.  Shower with CHG Soap the night before surgery and the  morning of surgery.  2.  If you choose to wash your hair, wash your hair first as usual with your normal  shampoo.  3.  After you shampoo, rinse your hair and body thoroughly to remove the shampoo.                             4.  Use CHG as you would any other liquid soap.  You can apply chg directly to the skin and wash.  Gently with a scrungie or clean washcloth.  5.  Apply the CHG Soap to your body ONLY FROM THE NECK DOWN.   Do   not use on face/ open                           Wound or open sores. Avoid contact with eyes, ears mouth and   genitals (private parts).                       Wash face,  Genitals (private parts) with your normal soap.             6.  Wash thoroughly, paying special attention to the area where your    surgery  will be performed.  7.  Thoroughly rinse your body with warm water from the neck down.  8.  DO NOT shower/wash with your normal soap after using and rinsing off the CHG Soap.                9.  Pat yourself dry with a clean towel.            10.  Wear clean pajamas.            11.  Place clean sheets on your bed the night of your first shower and do not  sleep with pets. Day of Surgery : Do not apply any lotions/deodorants the morning of surgery.  Please wear clean clothes to the hospital/surgery center.  FAILURE TO  FOLLOW THESE INSTRUCTIONS MAY RESULT IN THE CANCELLATION OF YOUR SURGERY  PATIENT SIGNATURE_________________________________  NURSE SIGNATURE__________________________________  ________________________________________________________________________

## 2023-01-29 ENCOUNTER — Encounter (HOSPITAL_COMMUNITY): Payer: Self-pay

## 2023-01-29 NOTE — Progress Notes (Signed)
COVID Vaccine Completed:  Yes  Date of COVID positive in last 90 days:  PCP - Ladora Daniel, PA-C Cardiologist -   Chest x-ray -  EKG -  Stress Test -  ECHO -  Cardiac Cath -  Pacemaker/ICD device last checked: Spinal Cord Stimulator:  Bowel Prep -   Sleep Study -  CPAP -   Fasting Blood Sugar -  Checks Blood Sugar _____ times a day  Last dose of GLP1 agonist-  N/A GLP1 instructions:  N/A   Last dose of SGLT-2 inhibitors-  N/A SGLT-2 instructions: N/A   Blood Thinner Instructions:  Time Aspirin Instructions: Last Dose:  Activity level:  Can go up a flight of stairs and perform activities of daily living without stopping and without symptoms of chest pain or shortness of breath.  Able to exercise without symptoms  Unable to go up a flight of stairs without symptoms of     Anesthesia review:   Patient denies shortness of breath, fever, cough and chest pain at PAT appointment  Patient verbalized understanding of instructions that were given to them at the PAT appointment. Patient was also instructed that they will need to review over the PAT instructions again at home before surgery.

## 2023-02-01 ENCOUNTER — Encounter (HOSPITAL_COMMUNITY)
Admission: RE | Admit: 2023-02-01 | Discharge: 2023-02-01 | Disposition: A | Discharge status: Home / Self Care | Payer: BC Managed Care – PPO | Source: Ambulatory Visit | Attending: Urology | Admitting: Urology

## 2023-02-01 ENCOUNTER — Encounter (HOSPITAL_COMMUNITY): Payer: Self-pay

## 2023-02-01 ENCOUNTER — Other Ambulatory Visit: Payer: Self-pay

## 2023-02-01 VITALS — BP 144/84 | HR 74 | Temp 98.0°F | Resp 16 | Ht 65.0 in | Wt 258.6 lb

## 2023-02-01 DIAGNOSIS — Z01812 Encounter for preprocedural laboratory examination: Secondary | ICD-10-CM | POA: Insufficient documentation

## 2023-02-01 DIAGNOSIS — Z01818 Encounter for other preprocedural examination: Secondary | ICD-10-CM | POA: Diagnosis present

## 2023-02-01 DIAGNOSIS — K769 Liver disease, unspecified: Secondary | ICD-10-CM | POA: Insufficient documentation

## 2023-02-01 HISTORY — DX: Fatty (change of) liver, not elsewhere classified: K76.0

## 2023-02-01 HISTORY — DX: Personal history of urinary calculi: Z87.442

## 2023-02-01 LAB — COMPREHENSIVE METABOLIC PANEL
ALT: 29 U/L (ref 0–44)
AST: 24 U/L (ref 15–41)
Albumin: 4.1 g/dL (ref 3.5–5.0)
Alkaline Phosphatase: 56 U/L (ref 38–126)
Anion gap: 8 (ref 5–15)
BUN: 19 mg/dL (ref 6–20)
CO2: 24 mmol/L (ref 22–32)
Calcium: 9.6 mg/dL (ref 8.9–10.3)
Chloride: 105 mmol/L (ref 98–111)
Creatinine, Ser: 1.04 mg/dL — ABNORMAL HIGH (ref 0.44–1.00)
GFR, Estimated: >60 mL/min (ref >60)
Glucose, Bld: 102 mg/dL — ABNORMAL HIGH (ref 70–99)
Potassium: 4.2 mmol/L (ref 3.5–5.1)
Sodium: 137 mmol/L (ref 135–145)
Total Bilirubin: 0.5 mg/dL (ref 0.3–1.2)
Total Protein: 7.4 g/dL (ref 6.5–8.1)

## 2023-02-02 ENCOUNTER — Other Ambulatory Visit: Payer: Self-pay | Admitting: Physician Assistant

## 2023-02-06 ENCOUNTER — Ambulatory Visit (HOSPITAL_COMMUNITY): Payer: BC Managed Care – PPO

## 2023-02-06 ENCOUNTER — Encounter (HOSPITAL_COMMUNITY)
Admission: RE | Disposition: A | Discharge status: Home / Self Care | Payer: Self-pay | Source: Home / Self Care | Attending: Urology

## 2023-02-06 ENCOUNTER — Other Ambulatory Visit (HOSPITAL_COMMUNITY): Payer: Self-pay

## 2023-02-06 ENCOUNTER — Other Ambulatory Visit: Payer: Self-pay

## 2023-02-06 ENCOUNTER — Ambulatory Visit (HOSPITAL_COMMUNITY)
Admission: RE | Admit: 2023-02-06 | Discharge: 2023-02-06 | Disposition: A | Discharge status: Home / Self Care | Payer: BC Managed Care – PPO | Attending: Urology | Admitting: Urology

## 2023-02-06 ENCOUNTER — Encounter (HOSPITAL_COMMUNITY): Payer: Self-pay | Admitting: Urology

## 2023-02-06 ENCOUNTER — Ambulatory Visit (HOSPITAL_COMMUNITY): Payer: BC Managed Care – PPO | Admitting: Anesthesiology

## 2023-02-06 DIAGNOSIS — G8929 Other chronic pain: Secondary | ICD-10-CM | POA: Insufficient documentation

## 2023-02-06 DIAGNOSIS — Z87442 Personal history of urinary calculi: Secondary | ICD-10-CM | POA: Diagnosis not present

## 2023-02-06 DIAGNOSIS — Z6841 Body Mass Index (BMI) 40.0 and over, adult: Secondary | ICD-10-CM | POA: Diagnosis not present

## 2023-02-06 DIAGNOSIS — R310 Gross hematuria: Secondary | ICD-10-CM | POA: Diagnosis present

## 2023-02-06 DIAGNOSIS — E039 Hypothyroidism, unspecified: Secondary | ICD-10-CM | POA: Diagnosis not present

## 2023-02-06 HISTORY — PX: CYSTOSCOPY/URETEROSCOPY/HOLMIUM LASER/STENT PLACEMENT: SHX6546

## 2023-02-06 SURGERY — CYSTOSCOPY/URETEROSCOPY/HOLMIUM LASER/STENT PLACEMENT
Anesthesia: General | Laterality: Right

## 2023-02-06 MED ORDER — LACTATED RINGERS IV SOLN: INTRAVENOUS | Status: DC

## 2023-02-06 MED ORDER — EPHEDRINE 5 MG/ML INJ
INTRAVENOUS | Status: AC
  Filled 2023-02-06: qty 5

## 2023-02-06 MED ORDER — DEXAMETHASONE SODIUM PHOSPHATE 10 MG/ML IJ SOLN
INTRAMUSCULAR | Status: DC | PRN
Start: 2023-02-06 — End: 2023-02-06
  Administered 2023-02-06: 5 mg via INTRAVENOUS

## 2023-02-06 MED ORDER — DEXMEDETOMIDINE HCL IN NACL 80 MCG/20ML IV SOLN
INTRAVENOUS | Status: DC | PRN
Start: 2023-02-06 — End: 2023-02-06
  Administered 2023-02-06: 8 ug via INTRAVENOUS

## 2023-02-06 MED ORDER — EPHEDRINE SULFATE (PRESSORS) 50 MG/ML IJ SOLN
INTRAMUSCULAR | Status: DC | PRN
Start: 2023-02-06 — End: 2023-02-06
  Administered 2023-02-06: 7 mg via INTRAVENOUS

## 2023-02-06 MED ORDER — SODIUM CHLORIDE 0.9 % IR SOLN
Status: DC | PRN
  Administered 2023-02-06: 3000 mL via INTRAVESICAL

## 2023-02-06 MED ORDER — FENTANYL CITRATE (PF) 100 MCG/2ML IJ SOLN
INTRAMUSCULAR | Status: AC
  Filled 2023-02-06: qty 2

## 2023-02-06 MED ORDER — MIDAZOLAM HCL 2 MG/2ML IJ SOLN
INTRAMUSCULAR | Status: AC
  Filled 2023-02-06: qty 2

## 2023-02-06 MED ORDER — OXYCODONE-ACETAMINOPHEN 5-325 MG PO TABS
1.0000 | ORAL_TABLET | ORAL | 0 refills | Status: AC | PRN
  Filled 2023-02-06: qty 12, 2d supply, fill #0

## 2023-02-06 MED ORDER — PHENAZOPYRIDINE HCL 200 MG PO TABS
200.0000 mg | ORAL_TABLET | Freq: Three times a day (TID) | ORAL | 0 refills | Status: AC | PRN
  Filled 2023-02-06: qty 30, 10d supply, fill #0

## 2023-02-06 MED ORDER — CEFAZOLIN SODIUM-DEXTROSE 2-4 GM/100ML-% IV SOLN
2.0000 g | INTRAVENOUS | Status: AC
  Administered 2023-02-06: 2 g via INTRAVENOUS
  Filled 2023-02-06: qty 100

## 2023-02-06 MED ORDER — CHLORHEXIDINE GLUCONATE 0.12 % MT SOLN
15.0000 mL | Freq: Once | OROMUCOSAL | Status: AC
  Administered 2023-02-06: 15 mL via OROMUCOSAL

## 2023-02-06 MED ORDER — FENTANYL CITRATE (PF) 100 MCG/2ML IJ SOLN
INTRAMUSCULAR | Status: DC | PRN
  Administered 2023-02-06: 25 ug via INTRAVENOUS
  Administered 2023-02-06: 50 ug via INTRAVENOUS
  Administered 2023-02-06: 25 ug via INTRAVENOUS

## 2023-02-06 MED ORDER — OXYBUTYNIN CHLORIDE 5 MG PO TABS
5.0000 mg | ORAL_TABLET | Freq: Three times a day (TID) | ORAL | 1 refills | Status: AC | PRN
Start: 2023-02-06 — End: ?
  Filled 2023-02-06: qty 30, 10d supply, fill #0

## 2023-02-06 MED ORDER — LIDOCAINE HCL (CARDIAC) PF 100 MG/5ML IV SOSY
PREFILLED_SYRINGE | INTRAVENOUS | Status: DC | PRN
  Administered 2023-02-06: 50 mg via INTRAVENOUS

## 2023-02-06 MED ORDER — PROPOFOL 10 MG/ML IV BOLUS
INTRAVENOUS | Status: DC | PRN
  Administered 2023-02-06: 170 mg via INTRAVENOUS

## 2023-02-06 MED ORDER — ORAL CARE MOUTH RINSE: 15.0000 mL | Freq: Once | OROMUCOSAL | Status: AC

## 2023-02-06 MED ORDER — MIDAZOLAM HCL 5 MG/5ML IJ SOLN
INTRAMUSCULAR | Status: DC | PRN
  Administered 2023-02-06 (×2): 1 mg via INTRAVENOUS

## 2023-02-06 MED ORDER — DEXAMETHASONE SODIUM PHOSPHATE 10 MG/ML IJ SOLN
INTRAMUSCULAR | Status: AC
  Filled 2023-02-06: qty 1

## 2023-02-06 MED ORDER — FENTANYL CITRATE PF 50 MCG/ML IJ SOSY: 25.0000 ug | PREFILLED_SYRINGE | INTRAMUSCULAR | Status: DC | PRN

## 2023-02-06 MED ORDER — ONDANSETRON HCL 4 MG/2ML IJ SOLN
INTRAMUSCULAR | Status: DC | PRN
  Administered 2023-02-06: 4 mg via INTRAVENOUS

## 2023-02-06 MED ORDER — ACETAMINOPHEN 500 MG PO TABS: 1000.0000 mg | ORAL_TABLET | Freq: Once | ORAL | Status: DC

## 2023-02-06 MED ORDER — IOHEXOL 300 MG/ML  SOLN
INTRAMUSCULAR | Status: DC | PRN
  Administered 2023-02-06: 6 mL

## 2023-02-06 SURGICAL SUPPLY — 22 items
BAG URO CATCHER STRL LF (MISCELLANEOUS) ×1 IMPLANT
BASKET ZERO TIP NITINOL 2.4FR (BASKET) IMPLANT
BSKT STON RTRVL ZERO TP 2.4FR (BASKET)
CATH URETL OPEN 5X70 (CATHETERS) ×1 IMPLANT
CLOTH BEACON ORANGE TIMEOUT ST (SAFETY) ×1 IMPLANT
EXTRACTOR STONE NITINOL NGAGE (UROLOGICAL SUPPLIES) IMPLANT
FIBER LASER MOSES 200 DFL (Laser) IMPLANT
FIBER LASER MOSES 365 DFL (Laser) IMPLANT
GLOVE SURG LX STRL 7.5 STRW (GLOVE) ×1 IMPLANT
GOWN SRG XL LVL 4 BRTHBL STRL (GOWNS) ×1 IMPLANT
GOWN STRL NON-REIN XL LVL4 (GOWNS) ×1
GUIDEWIRE STR DUAL SENSOR (WIRE) IMPLANT
GUIDEWIRE ZIPWRE .038 STRAIGHT (WIRE) ×1 IMPLANT
KIT TURNOVER KIT A (KITS) IMPLANT
MANIFOLD NEPTUNE II (INSTRUMENTS) ×1 IMPLANT
PACK CYSTO (CUSTOM PROCEDURE TRAY) ×1 IMPLANT
PAD PREP 24X48 CUFFED NSTRL (MISCELLANEOUS) ×1 IMPLANT
SHEATH NAVIGATOR HD 11/13X36 (SHEATH) IMPLANT
STENT URET 6FRX24 CONTOUR (STENTS) IMPLANT
STENT URET 6FRX26 CONTOUR (STENTS) IMPLANT
TUBING CONNECTING 10 (TUBING) ×1 IMPLANT
TUBING UROLOGY SET (TUBING) ×1 IMPLANT

## 2023-02-06 NOTE — Transfer of Care (Signed)
Immediate Anesthesia Transfer of Care Note  Patient: Juluis Pitch  Procedure(s) Performed: CYSTOSCOPY, RIGHT URETEROSCOPY, HOLMIUM LASER LITHOTRIPSY, AND RIGHT URETERAL STENT PLACEMENT (Right)  Patient Location: PACU  Anesthesia Type:General  Level of Consciousness: awake, alert , oriented, and patient cooperative  Airway & Oxygen Therapy: Patient Spontanous Breathing and Patient connected to face mask oxygen  Post-op Assessment: Report given to RN and Post -op Vital signs reviewed and stable  Post vital signs: Reviewed and stable  Last Vitals:  Vitals Value Taken Time  BP 143/96 02/06/23 0928  Temp    Pulse 80 02/06/23 0930  Resp 15 02/06/23 0930  SpO2 98 % 02/06/23 0930  Vitals shown include unfiled device data.  Last Pain:  Vitals:   02/06/23 0703  TempSrc:   PainSc: 0-No pain         Complications: No notable events documented.

## 2023-02-06 NOTE — Op Note (Signed)
Operative Note  Preoperative diagnosis:  1.  Two 5 mm right renal stones  Postoperative diagnosis: 1.  Same  Procedure(s): 1.  Cystoscopy with right ureteroscopy, holmium laser lithotripsy and right JJ stent placement 2.  Right retrograde pyelogram with intraoperative interpretation of fluoroscopic imaging  Surgeon: Rhoderick Moody, MD  Assistants:  None  Anesthesia:  General  Complications:  None  EBL: Less than 5 mL  Specimens: 1.  None  Drains/Catheters: 1.  Right 6 French, 24 cm JJ stent with tether  Intraoperative findings:   No intravesical or ureteral abnormalities Two 5 mm right renal stones that were dusted with the holmium laser  Indication:  Whitney Huynh is a 62 y.o. female with a history of kidney stones and hematuria.  She was found to have multiple nonobstructing right renal stones on recent CT urogram and would like to preemptively treat them.  She has been consented for the above procedures, voices understanding wishes to proceed.  Description of procedure:  After informed consent was obtained, the patient was brought to the operating room and general LMA anesthesia was administered. The patient was then placed in the dorsolithotomy position and prepped and draped in the usual sterile fashion. A timeout was performed. A 23 French rigid cystoscope was then inserted into the urethral meatus and advanced into the bladder under direct vision. A complete bladder survey revealed no intravesical pathology.  A 5 French ureteral catheter was then inserted into the right ureteral orifice and a retrograde pyelogram was obtained, with the findings listed above.  A Glidewire was then used to intubate the lumen of the ureteral catheter and was advanced up to the right renal pelvis, under fluoroscopic guidance.  The catheter was then removed, leaving the wire in place.  A flexible ureteroscope was then advanced up the right ureter and into the renal pelvis where her 2  stones were identified.  A 200 m holmium laser was then used to dust the stones into less than 2 mm fragments.  The flexible ureteroscope was then removed under direct vision, identifying no luminal stone burden or evidence of ureteral trauma.  A 6 French, 24 cm JJ stent was then advanced over the Glidewire and into good position within the right collecting system, confirming placement via fluoroscopy.  The tether the stent was left intact and tucked into the vaginal vault.  The patient's bladder was drained.  She tolerated the procedure well and was transferred to the postanesthesia in stable condition.  Plan: Remove stent at 7 AM on 02/09/2023.  Follow-up in 6 weeks for right renal ultrasound

## 2023-02-06 NOTE — Anesthesia Preprocedure Evaluation (Addendum)
Anesthesia Evaluation  Patient identified by MRN, date of birth, ID band Patient awake    Reviewed: Allergy & Precautions, H&P , NPO status , Patient's Chart, lab work & pertinent test results  Airway Mallampati: III  TM Distance: >3 FB Neck ROM: Full    Dental no notable dental hx. (+) Teeth Intact, Dental Advisory Given   Pulmonary neg pulmonary ROS   Pulmonary exam normal breath sounds clear to auscultation       Cardiovascular negative cardio ROS  Rhythm:Regular Rate:Normal     Neuro/Psych   Anxiety Depression    negative neurological ROS     GI/Hepatic negative GI ROS, Neg liver ROS,,,  Endo/Other  Hypothyroidism  Morbid obesity  Renal/GU Renal disease  negative genitourinary   Musculoskeletal   Abdominal   Peds  Hematology negative hematology ROS (+)   Anesthesia Other Findings   Reproductive/Obstetrics negative OB ROS                             Anesthesia Physical Anesthesia Plan  ASA: 3  Anesthesia Plan: General   Post-op Pain Management: Tylenol PO (pre-op)*   Induction: Intravenous  PONV Risk Score and Plan: 4 or greater and Ondansetron, Dexamethasone and Midazolam  Airway Management Planned: LMA  Additional Equipment:   Intra-op Plan:   Post-operative Plan: Extubation in OR  Informed Consent: I have reviewed the patients History and Physical, chart, labs and discussed the procedure including the risks, benefits and alternatives for the proposed anesthesia with the patient or authorized representative who has indicated his/her understanding and acceptance.     Dental advisory given  Plan Discussed with: CRNA  Anesthesia Plan Comments:        Anesthesia Quick Evaluation

## 2023-02-06 NOTE — Anesthesia Procedure Notes (Signed)
Procedure Name: LMA Insertion Date/Time: 02/06/2023 8:53 AM  Performed by: Garth Bigness, CRNAPre-anesthesia Checklist: Patient identified, Emergency Drugs available, Suction available and Patient being monitored Patient Re-evaluated:Patient Re-evaluated prior to induction Oxygen Delivery Method: Circle system utilized Preoxygenation: Pre-oxygenation with 100% oxygen Induction Type: IV induction Ventilation: Mask ventilation without difficulty LMA: LMA inserted LMA Size: 4.0 Tube type: Oral Number of attempts: 1 Placement Confirmation: positive ETCO2 and breath sounds checked- equal and bilateral Tube secured with: Tape Dental Injury: Teeth and Oropharynx as per pre-operative assessment

## 2023-02-06 NOTE — H&P (Signed)
Office Visit Report     01/15/2023   --------------------------------------------------------------------------------   Whitney Huynh  MRN: [id number]  DOB: March 05, 1961, 62 year old Female  PRIMARY CARE:  Ladora Daniel, [place]  PRIMARY CARE FAX:  [phone]  REFERRING:  Si Raider. Liliane Shi, MD  PROVIDER:  Rhoderick Moody, M.D.  LOCATION:  [facility] [phone]     --------------------------------------------------------------------------------   CC/HPI: CC: Hematuria   HPI: Ms. Whitney Huynh is a 62 year old female with gross hematuria   -Gross hematuria with small clots since 11/22/22. Associated with mild crampy back pain, but she admits that she has chronic back pain.  -Smoking history: non-smoker  -History of nephrolithiasis: Remote hx of stones and required lithotripsy in 2007  -History of CKD: denies  -Anti-coagulant/anti-platelet use: denies  -Hematologic disorders: denies  -History of UTIs: denies  -History of GU surgery/trauma: denies  -Personal/family history of GU malignancies: Bladder cancer involving her father (non-smoker)  -Denies dysuria or any bothersome LUTS   01/15/23: The patient is here today for a cystoscopy to complete her hematuria evaluation. CTU from August showed non-obstructing right renal stones with no other GU abnormalities. She notes a 30 minute episode of right flank pain 1 week ago that has not recurred and was not associated with any other sxs.     ALLERGIES: morphine - Itching    MEDICATIONS: Abilify  Primpro  Synthroid 175 mcg tablet  Vibryd  Wellbutrin Xl 300 mg tablet, extended release 24 hr     GU PSH: Cysto Uretero Lithotripsy Locm 300-399Mg /Ml Iodine,1Ml - 12/11/2022       PSH Notes: back surgery   NON-GU PSH: Cesarean Delivery, 92, 95, 98     GU PMH: Gross hematuria - 12/11/2022, - 11/27/2022 History of urolithiasis - 11/27/2022 Ureteral calculus (Acute), Left, No stone is seen along the anticipated course of her  left ureter on KUB today and her urine is free of any red cells. She is also asymptomatic. I told her it appears she has passed her stone and there are no renal calculi present. I have supplied her with information on stone prevention. - 2020    NON-GU PMH: Anxiety Depression    FAMILY HISTORY: 1 Daughter - Daughter 2 sons - Son Bladder Cancer - Father   SOCIAL HISTORY: Marital Status: Married Preferred Language: English; Ethnicity: Not Hispanic Or Latino; Race: White Current Smoking Status: Patient has never smoked.   Tobacco Use Assessment Completed: Used Tobacco in last 30 days? Social Drinker.  Drinks 1 caffeinated drink per day.    REVIEW OF SYSTEMS:    GU Review Female:   Patient denies frequent urination, hard to postpone urination, burning /pain with urination, get up at night to urinate, leakage of urine, stream starts and stops, trouble starting your stream, have to strain to urinate, and being pregnant.  Gastrointestinal (Upper):   Patient denies nausea, vomiting, and indigestion/ heartburn.  Gastrointestinal (Lower):   Patient denies diarrhea and constipation.  Constitutional:   Patient denies fever, night sweats, weight loss, and fatigue.  Skin:   Patient denies skin rash/ lesion and itching.  Eyes:   Patient denies blurred vision and double vision.  Ears/ Nose/ Throat:   Patient denies sinus problems and sore throat.  Hematologic/Lymphatic:   Patient denies swollen glands and easy bruising.  Cardiovascular:   Patient denies leg swelling and chest pains.  Respiratory:   Patient denies cough and shortness of breath.  Endocrine:   Patient denies excessive thirst.  Musculoskeletal:  Patient denies back pain and joint pain.  Neurological:   Patient denies headaches and dizziness.  Psychologic:   Patient denies depression and anxiety.   VITAL SIGNS: None   GU PHYSICAL EXAMINATION:    External Genitalia: No hirsutism, no rash, no scarring, no cyst, no erythematous  lesion, no papular lesion, no blanched lesion, no warty lesion. No edema.  Urethral Meatus: Normal size. Normal position. No discharge.  Urethra: No tenderness, no mass, no scarring. No hypermobility. No leakage.  Bladder: Normal to palpation, no tenderness, no mass, normal size.  Vagina: No atrophy, no stenosis. No rectocele. No cystocele. No enterocele.   MULTI-SYSTEM PHYSICAL EXAMINATION:    Constitutional: Well-nourished. No physical deformities. Normally developed. Good grooming.  Neurologic / Psychiatric: Oriented to time, oriented to place, oriented to person. No depression, no anxiety, no agitation.     Complexity of Data:  Lab Test Review:   BMP  Records Review:   Previous Patient Records  X-Ray Review: C.T. Abdomen/Pelvis: Reviewed Films. Reviewed Report. Discussed With Patient.     11/27/22  General Chemistry  Sodium 144 mEq/L  Potassium 4.6 mEq/L  BUN 17 mg/dL  Creatinine 1.0 mg/dL  Chloride 161 mEq/L  CO2 28 mEq/L  Glucose 105 mg/dL  Calcium 9.6 mg/dL  eGFR African American 71.4   eGFR Non-Afr. American 61.6   Notes:                     CLINICAL DATA: Painless gross hematuria. Nephrolithiasis.   EXAM:  CT ABDOMEN AND PELVIS WITHOUT AND WITH CONTRAST   TECHNIQUE:  Multidetector CT imaging of the abdomen and pelvis was performed  following the standard protocol before and following the bolus  administration of intravenous contrast.   RADIATION DOSE REDUCTION: This exam was performed according to the  departmental dose-optimization program which includes automated  exposure control, adjustment of the mA and/or kV according to  patient size and/or use of iterative reconstruction technique.   CONTRAST: 125 mL Omnipaque 300   COMPARISON: None Available.   FINDINGS:  Lower Chest: No acute findings.   Hepatobiliary: No suspicious hepatic masses identified. Gallbladder  is unremarkable. No evidence of biliary ductal dilatation.   Pancreas: No mass or  inflammatory changes.   Spleen: Within normal limits in size and appearance.   Adrenals/Urinary Tract: No adrenal masses identified. Several right  renal calculi are seen measuring up to 5 mm. No evidence of ureteral  calculi or hydronephrosis. No suspicious renal masses identified. No  masses seen involving the collecting systems, ureters, or bladder.   Stomach/Bowel: No evidence of obstruction, inflammatory process or  abnormal fluid collections.   Vascular/Lymphatic: No pathologically enlarged lymph nodes. No acute  vascular findings.   Reproductive: Few small uterine fibroids are noted. Adnexal regions  are unremarkable.   Other: None.   Musculoskeletal: No suspicious bone lesions identified.   IMPRESSION:  Right nephrolithiasis. No evidence of ureteral calculi,  hydronephrosis, or other acute findings.   No radiographic evidence of urinary tract neoplasm.   Small uterine fibroids.    Electronically Signed  By: Danae Orleans M.D.  On: 12/14/2022 08:29      PROCEDURES:         Flexible Cystoscopy - 52000  Risks, benefits, and some of the potential complications of the procedure were discussed at length with the patient including infection, bleeding, voiding discomfort, urinary retention, fever, chills, sepsis, and others. All questions were answered. Informed consent was obtained. Antibiotic prophylaxis was given. Sterile  technique and intraurethral analgesia were used.  Meatus:  Normal size. Normal location. Normal condition.  Urethra:  No hypermobility. No leakage.  Ureteral Orifices:  Normal location. Normal size. Normal shape. Effluxed clear urine.  Bladder:  No trabeculation. No tumors. Normal mucosa. No stones.      The lower urinary tract was carefully examined. The procedure was well-tolerated and without complications. Antibiotic instructions were given. Instructions were given to call the office immediately for bloody urine, difficulty urinating, urinary  retention, painful or frequent urination, fever, chills, nausea, vomiting or other illness. The patient stated that she understood these instructions and would comply with them.         Urinalysis Dipstick Dipstick Cont'd  Color: Yellow Bilirubin: Neg mg/dL  Appearance: Clear Ketones: Neg mg/dL  Specific Gravity: 3.086 Blood: Neg ery/uL  pH: 5.5 Protein: Neg mg/dL  Glucose: Neg mg/dL Urobilinogen: 0.2 mg/dL    Nitrites: Neg    Leukocyte Esterase: Neg leu/uL    ASSESSMENT:      ICD-10 Details  1 GU:   Renal calculus - N20.0 Right, Undiagnosed New Problem   PLAN:           Schedule Return Visit/Planned Activity: Next Available Appointment - Firm-EST, Schedule Surgery          Document Letter(s):  Created for Patient: Clinical Summary         Notes:   -Cystoscopy today was WNL  The risks, benefits and alternatives of cystoscopy with RIGHT ureteroscopy, laser lithotripsy and ureteral stent placement was discussed the patient. Risks included, but are not limited to: bleeding, urinary tract infection, ureteral injury/avulsion, ureteral stricture formation, retained stone fragments, the possibility that multiple surgeries may be required to treat the stone(s), MI, stroke, PE and the inherent risks of general anesthesia. The patient voices understanding and wishes to proceed.

## 2023-02-06 NOTE — Anesthesia Postprocedure Evaluation (Signed)
Anesthesia Post Note  Patient: Whitney Huynh  Procedure(s) Performed: CYSTOSCOPY, RIGHT URETEROSCOPY, HOLMIUM LASER LITHOTRIPSY, AND RIGHT URETERAL STENT PLACEMENT (Right)     Patient location during evaluation: PACU Anesthesia Type: General Level of consciousness: awake and alert Pain management: pain level controlled Vital Signs Assessment: post-procedure vital signs reviewed and stable Respiratory status: spontaneous breathing, nonlabored ventilation and respiratory function stable Cardiovascular status: blood pressure returned to baseline and stable Postop Assessment: no apparent nausea or vomiting Anesthetic complications: no  No notable events documented.  Last Vitals:  Vitals:   02/06/23 1011 02/06/23 1015  BP: (!) 146/83 137/85  Pulse: 76 78  Resp:    Temp: 36.4 C   SpO2: 92% 92%    Last Pain:  Vitals:   02/06/23 1011  TempSrc: Oral  PainSc: 2                  Annaston Upham,W. EDMOND

## 2023-02-07 ENCOUNTER — Telehealth: Payer: BC Managed Care – PPO | Admitting: Physician Assistant

## 2023-02-07 ENCOUNTER — Encounter (HOSPITAL_COMMUNITY): Payer: Self-pay | Admitting: Urology

## 2023-02-07 DIAGNOSIS — F3341 Major depressive disorder, recurrent, in partial remission: Secondary | ICD-10-CM

## 2023-02-07 DIAGNOSIS — F411 Generalized anxiety disorder: Secondary | ICD-10-CM

## 2023-02-07 DIAGNOSIS — G4700 Insomnia, unspecified: Secondary | ICD-10-CM | POA: Diagnosis not present

## 2023-02-07 DIAGNOSIS — Z6379 Other stressful life events affecting family and household: Secondary | ICD-10-CM

## 2023-02-07 MED ORDER — VILAZODONE HCL 40 MG PO TABS: 40.0000 mg | ORAL_TABLET | Freq: Every day | ORAL | 1 refills | Status: DC

## 2023-02-07 MED ORDER — LORAZEPAM 1 MG PO TABS: 1.0000 mg | ORAL_TABLET | Freq: Three times a day (TID) | ORAL | 5 refills | Status: DC | PRN

## 2023-02-07 NOTE — Progress Notes (Addendum)
[facility]  Patient ID: Whitney Huynh,  MRN: [id number]  PCP: Ladora Daniel, PA-C  Date of Evaluation: 02/12/2023 Time spent:20 minutes  Chief Complaint:  Chief Complaint   Anxiety; Depression; Follow-up   Virtual Visit via Telehealth  I connected with patient by a video enabled telemedicine application with their informed consent, and verified patient privacy and that I am speaking with the correct person using two identifiers.  I am private, in my office and the patient is at home.  I discussed the limitations, risks, security and privacy concerns of performing an evaluation and management service by video and the availability of in person appointments. I also discussed with the patient that there may be a patient responsible charge related to this service. The patient expressed understanding and agreed to proceed.   I discussed the assessment and treatment plan with the patient. The patient was provided an opportunity to ask questions and all were answered. The patient agreed with the plan and demonstrated an understanding of the instructions.   The patient was advised to call back or seek an in-person evaluation if the symptoms worsen or if the condition fails to improve as anticipated.  I provided 20   minutes of non-face-to-face time during this encounter.  HISTORY/CURRENT STATUS: HPI For 6 month med check.  She has a new grandchild, she had a ureteral stent removed yesterday, there are still stressors with her son and daughter-in-law, but even through it all she feels like her mental health is stable.  Patient is able to enjoy things.  Energy and motivation are good.  Work is going well.   No extreme sadness, tearfulness, or feelings of hopelessness.  Sleeps well most of the time. ADLs and personal hygiene are normal.   Denies any changes in concentration, making decisions, or remembering things.  Appetite has not changed.  She has gained some weight since our last  visit, due to stress and she is aware of it and is trying to eat healthier.  She has needed the Ativan more often lately with all the stress that she is going through.  It is still effective.  Denies suicidal or homicidal thoughts.  Patient denies increased energy with decreased need for sleep, increased talkativeness, racing thoughts, impulsivity or risky behaviors, increased spending, increased libido, grandiosity, increased irritability or anger, paranoia, or hallucinations.  Denies dizziness, syncope, seizures, numbness, tingling, tremor, tics, unsteady gait, slurred speech, confusion. Denies muscle or joint pain, stiffness, or dystonia. Denies unexplained weight loss, frequent infections, or sores that heal slowly.  No polyphagia, polydipsia, or polyuria. Denies visual changes or paresthesias.   Individual Medical History/ Review of Systems: Changes? :Yes  Kidney stone, see notes on chart  Past medications for mental health diagnoses include: Zoloft, Prozac, Lexapro, Wellbutrin, Viibryd, Cymbalta, Ativan, Abilify  Allergies: Morphine  Current Medications:  Current Outpatient Medications:    ARIPiprazole (ABILIFY) 5 MG tablet, TAKE 1 TABLET (5 MG TOTAL) BY MOUTH DAILY., Disp: 90 tablet, Rfl: 0   buPROPion (WELLBUTRIN XL) 300 MG 24 hr tablet, Take 1 tablet (300 mg total) by mouth daily., Disp: 90 tablet, Rfl: 1   estrogen, conjugated,-medroxyprogesterone (PREMPRO) 0.3-1.5 MG tablet, Take 1 tablet by mouth in the morning., Disp: , Rfl:    Melatonin 10 MG TABS, Take 10 mg by mouth at bedtime., Disp: , Rfl:    naproxen sodium (ALEVE) 220 MG tablet, Take 440 mg by mouth 2 (two) times daily as needed (pain.)., Disp: , Rfl:    NON  FORMULARY, Take 2 capsules by mouth daily at 12 noon. Protandim dietary supplement, Disp: , Rfl:    oxybutynin (DITROPAN) 5 MG tablet, Take 1 tablet (5 mg total) by mouth every 8 (eight) hours as needed for bladder spasms., Disp: 30 tablet, Rfl: 1   phenazopyridine  (PYRIDIUM) 200 MG tablet, Take 1 tablet (200 mg total) by mouth 3 (three) times daily as needed (for pain with urination)., Disp: 30 tablet, Rfl: 0   SYNTHROID 150 MCG tablet, Take 150 mcg by mouth daily before breakfast., Disp: , Rfl:    LORazepam (ATIVAN) 1 MG tablet, Take 1 tablet (1 mg total) by mouth every 8 (eight) hours as needed for anxiety., Disp: 90 tablet, Rfl: 5   OVER THE COUNTER MEDICATION, Take 0.5 fluid ounces by mouth daily. Electrolyze (Electrolyte Imbalance Solution), Disp: , Rfl:    oxyCODONE-acetaminophen (PERCOCET) 5-325 MG tablet, Take 1 tablet by mouth every 4 (four) hours as needed for severe pain. (Patient not taking: Reported on 02/07/2023), Disp: 12 tablet, Rfl: 0   Vilazodone HCl (VIIBRYD) 40 MG TABS, Take 1 tablet (40 mg total) by mouth daily., Disp: 90 tablet, Rfl: 1 Medication Side Effects: none  Family Medical/ Social History: Changes? No  MENTAL HEALTH EXAM:  Last menstrual period 02/17/2013.There is no height or weight on file to calculate BMI.  General Appearance: Casual and Well Groomed  Eye Contact:  Good  Speech:  Clear and Coherent and Normal Rate  Volume:  Normal  Mood:  Euthymic  Affect:  Congruent  Thought Process:  Goal Directed and Descriptions of Associations: Circumstantial  Orientation:  Full (Time, Place, and Person)  Thought Content: Logical   Suicidal Thoughts:  No  Homicidal Thoughts:  No  Memory:  WNL  Judgement:  Good  Insight:  Good  Psychomotor Activity:  Normal  Concentration:  Concentration: Good  Recall:  Good  Fund of Knowledge: Good  Language: Good  Assets:  Desire for Improvement Financial Resources/Insurance Housing Transportation Vocational/Educational  ADL's:  Intact  Cognition: WNL  Prognosis:  Good   PCP follows labs  DIAGNOSES:    ICD-10-CM   1. Recurrent major depressive disorder, in partial remission (HCC)  F33.41     2. Generalized anxiety disorder  F41.1     3. Insomnia, unspecified type  G47.00      4. Stressful life events affecting family and household  Z63.79      Receiving Psychotherapy: No   RECOMMENDATIONS:  PDMP reviewed.  Ativan filled 01/30/2023.  Oxycodone filled 02/06/2023, small amount for kidney stone. I provided 20 minutes of non-face to face time during this encounter, including time spent before and after the visit in records review, medical decision making, counseling pertinent to today's visit, and charting.   From a mental health medication standpoint she is doing well so no changes will be made.  Continue Abilify 5 mg daily. Continue Wellbutrin XL 300 mg, 1 p.o. every morning. Continue Ativan 1 mg, 1 p.o. 3 times daily as needed anxiety. Continue Viibryd 40 mg, 1 p.o. daily. Return in 6 months.  Melony Overly, PA-C

## 2023-03-23 ENCOUNTER — Other Ambulatory Visit (HOSPITAL_BASED_OUTPATIENT_CLINIC_OR_DEPARTMENT_OTHER): Payer: Self-pay

## 2023-04-02 ENCOUNTER — Other Ambulatory Visit: Payer: Self-pay | Admitting: Physician Assistant

## 2023-05-03 ENCOUNTER — Other Ambulatory Visit: Payer: Self-pay | Admitting: Physician Assistant

## 2023-08-08 ENCOUNTER — Telehealth: Payer: BC Managed Care – PPO | Admitting: Physician Assistant

## 2023-08-08 ENCOUNTER — Encounter: Payer: Self-pay | Admitting: Physician Assistant

## 2023-08-08 DIAGNOSIS — F411 Generalized anxiety disorder: Secondary | ICD-10-CM

## 2023-08-08 DIAGNOSIS — F3341 Major depressive disorder, recurrent, in partial remission: Secondary | ICD-10-CM | POA: Diagnosis not present

## 2023-08-08 DIAGNOSIS — G4700 Insomnia, unspecified: Secondary | ICD-10-CM

## 2023-08-08 MED ORDER — VILAZODONE HCL 40 MG PO TABS: 40.0000 mg | ORAL_TABLET | Freq: Every day | ORAL | 1 refills | Status: DC

## 2023-08-08 MED ORDER — LORAZEPAM 1 MG PO TABS: 1.0000 mg | ORAL_TABLET | Freq: Three times a day (TID) | ORAL | 5 refills | Status: DC | PRN

## 2023-08-08 MED ORDER — ARIPIPRAZOLE 5 MG PO TABS: 5.0000 mg | ORAL_TABLET | Freq: Every day | ORAL | 1 refills | Status: DC

## 2023-08-08 NOTE — Progress Notes (Signed)
 [facility]  Patient ID: Whitney Huynh,  MRN: [id number]  PCP: Whitney Daniel, PA-C  Date of Evaluation: 08/08/2023 Time spent:20 minutes  Chief Complaint:  Chief Complaint   Anxiety; Depression; Follow-up    Virtual Visit via Telehealth  I connected with patient by a video enabled telemedicine application with their informed consent, and verified patient privacy and that I am speaking with the correct person using two identifiers.  I am private, in my office and the patient is at home.  I discussed the limitations, risks, security and privacy concerns of performing an evaluation and management service by video and the availability of in person appointments. I also discussed with the patient that there may be a patient responsible charge related to this service. The patient expressed understanding and agreed to proceed.   I discussed the assessment and treatment plan with the patient. The patient was provided an opportunity to ask questions and all were answered. The patient agreed with the plan and demonstrated an understanding of the instructions.   The patient was advised to call back or seek an in-person evaluation if the symptoms worsen or if the condition fails to improve as anticipated.  I provided 20  minutes of non-face-to-face time during this encounter.  HISTORY/CURRENT STATUS: HPI For 6 month med check.  Whitney Huynh is doing well. Her meds are working as they should.  The family stressors with her son and daughter-in-law have improved a lot in the past 6 months.  The anxiety that was causing has not been as bad.  Not having panic attacks but still gets overwhelmed easily.  Patient is able to enjoy things.  Energy and motivation are good.  Work is going well.   No extreme sadness, tearfulness, or feelings of hopelessness.  Sleeps well most of the time. ADLs and personal hygiene are normal.   Denies any changes in concentration, making decisions, or remembering things.  She  has lost some weight, intentionally.  Denies suicidal or homicidal thoughts.  Patient denies increased energy with decreased need for sleep, increased talkativeness, racing thoughts, impulsivity or risky behaviors, increased spending, increased libido, grandiosity, increased irritability or anger, paranoia, or hallucinations.  Denies dizziness, syncope, seizures, numbness, tingling, tremor, tics, unsteady gait, slurred speech, confusion. Denies muscle or joint pain, stiffness, or dystonia. Denies unexplained weight loss, frequent infections, or sores that heal slowly.  No polyphagia, polydipsia, or polyuria. Denies visual changes or paresthesias.   Individual Medical History/ Review of Systems: Changes? :No   Past medications for mental health diagnoses include: Zoloft, Prozac, Lexapro, Wellbutrin, Viibryd, Cymbalta, Ativan, Abilify  Allergies: Morphine  Current Medications:  Current Outpatient Medications:    buPROPion (WELLBUTRIN XL) 300 MG 24 hr tablet, TAKE 1 TABLET BY MOUTH EVERY DAY, Disp: 90 tablet, Rfl: 1   Melatonin 10 MG TABS, Take 10 mg by mouth at bedtime., Disp: , Rfl:    naproxen sodium (ALEVE) 220 MG tablet, Take 440 mg by mouth 2 (two) times daily as needed (pain.)., Disp: , Rfl:    SYNTHROID 150 MCG tablet, Take 150 mcg by mouth daily before breakfast., Disp: , Rfl:    tirzepatide (ZEPBOUND) 5 MG/0.5ML injection vial, Inject into the skin., Disp: , Rfl:    ARIPiprazole (ABILIFY) 5 MG tablet, Take 1 tablet (5 mg total) by mouth daily., Disp: 90 tablet, Rfl: 1   estrogen, conjugated,-medroxyprogesterone (PREMPRO) 0.3-1.5 MG tablet, Take 1 tablet by mouth in the morning. (Patient not taking: Reported on 08/08/2023), Disp: , Rfl:  LORazepam (ATIVAN) 1 MG tablet, Take 1 tablet (1 mg total) by mouth every 8 (eight) hours as needed for anxiety., Disp: 90 tablet, Rfl: 5   NON FORMULARY, Take 2 capsules by mouth daily at 12 noon. Protandim dietary supplement (Patient not taking:  Reported on 08/08/2023), Disp: , Rfl:    OVER THE COUNTER MEDICATION, Take 0.5 fluid ounces by mouth daily. Electrolyze (Electrolyte Imbalance Solution) (Patient not taking: Reported on 08/08/2023), Disp: , Rfl:    oxybutynin (DITROPAN) 5 MG tablet, Take 1 tablet (5 mg total) by mouth every 8 (eight) hours as needed for bladder spasms. (Patient not taking: Reported on 08/08/2023), Disp: 30 tablet, Rfl: 1   oxyCODONE-acetaminophen (PERCOCET) 5-325 MG tablet, Take 1 tablet by mouth every 4 (four) hours as needed for severe pain. (Patient not taking: Reported on 08/08/2023), Disp: 12 tablet, Rfl: 0   phenazopyridine (PYRIDIUM) 200 MG tablet, Take 1 tablet (200 mg total) by mouth 3 (three) times daily as needed (for pain with urination). (Patient not taking: Reported on 08/08/2023), Disp: 30 tablet, Rfl: 0   Vilazodone HCl (VIIBRYD) 40 MG TABS, Take 1 tablet (40 mg total) by mouth daily., Disp: 90 tablet, Rfl: 1 Medication Side Effects: none  Family Medical/ Social History: Changes? No  MENTAL HEALTH EXAM:  Last menstrual period 02/17/2013.There is no height or weight on file to calculate BMI.  General Appearance: Casual and Well Groomed  Eye Contact:  Good  Speech:  Clear and Coherent and Normal Rate  Volume:  Normal  Mood:  Euthymic  Affect:  Congruent  Thought Process:  Goal Directed and Descriptions of Associations: Circumstantial  Orientation:  Full (Time, Place, and Person)  Thought Content: Logical   Suicidal Thoughts:  No  Homicidal Thoughts:  No  Memory:  WNL  Judgement:  Good  Insight:  Good  Psychomotor Activity:  Normal  Concentration:  Concentration: Good  Recall:  Good  Fund of Knowledge: Good  Language: Good  Assets:  Desire for Improvement Financial Resources/Insurance Housing Transportation Vocational/Educational  ADL's:  Intact  Cognition: WNL  Prognosis:  Good   PCP follows labs  DIAGNOSES:    ICD-10-CM   1. Recurrent major depressive disorder, in partial remission  (HCC)  F33.41     2. Generalized anxiety disorder  F41.1     3. Insomnia, unspecified type  G47.00       Receiving Psychotherapy: No   RECOMMENDATIONS:  PDMP reviewed.  Ativan filled 07/20/2023. I provided 20 minutes of non-face-to-face time during this encounter, including time spent before and after the visit in records review, medical decision making, counseling pertinent to today's visit, and charting.   Aradia is doing well on current meds so no changes need to be made.   Continue Abilify 5 mg daily. Continue Wellbutrin XL 300 mg, 1 p.o. every morning. Continue Ativan 1 mg, 1 p.o. 3 times daily as needed anxiety. Continue Viibryd 40 mg, 1 p.o. daily. Return in 6 months.  Melony Overly, PA-C

## 2023-09-25 ENCOUNTER — Other Ambulatory Visit: Payer: Self-pay | Admitting: Physician Assistant

## 2024-01-31 ENCOUNTER — Other Ambulatory Visit: Payer: Self-pay | Admitting: Physician Assistant

## 2024-02-06 ENCOUNTER — Other Ambulatory Visit: Payer: Self-pay | Admitting: Physician Assistant

## 2024-02-06 DIAGNOSIS — F411 Generalized anxiety disorder: Secondary | ICD-10-CM

## 2024-02-07 NOTE — Telephone Encounter (Signed)
Pt is scheduled for 10/7

## 2024-02-07 NOTE — Telephone Encounter (Signed)
 Please call to schedule FU, due now.

## 2024-02-12 ENCOUNTER — Encounter: Payer: Self-pay | Admitting: Physician Assistant

## 2024-02-12 ENCOUNTER — Ambulatory Visit: Admitting: Physician Assistant

## 2024-02-12 DIAGNOSIS — F3341 Major depressive disorder, recurrent, in partial remission: Secondary | ICD-10-CM | POA: Diagnosis not present

## 2024-02-12 DIAGNOSIS — G4700 Insomnia, unspecified: Secondary | ICD-10-CM

## 2024-02-12 DIAGNOSIS — F411 Generalized anxiety disorder: Secondary | ICD-10-CM | POA: Diagnosis not present

## 2024-02-12 MED ORDER — LORAZEPAM 1 MG PO TABS
1.0000 mg | ORAL_TABLET | Freq: Four times a day (QID) | ORAL | 5 refills | Status: AC | PRN
Start: 2024-02-12 — End: ?

## 2024-02-12 NOTE — Progress Notes (Signed)
 [facility]  Patient ID: Whitney Huynh,  MRN: [id number]  PCP: Samie Frederick, PA-C  Date of Evaluation: 02/12/2024 Time spent:20 minutes  Chief Complaint:  Chief Complaint   Follow-up    HISTORY/CURRENT STATUS: HPI For 6 month med check.  Has been taking more Ativan  lately.  Under a lot of stress with helping a family member financially.  She doesn't feel like it's a brain chemistry issue, but anxiety is circumstantial.  Xanax is helpful but doesn't really last long enough. Work is going well.   Gets tearful when thinking about all the stress.  Sleeps well most of the time.  Mg has helped with that.  ADLs and personal hygiene are normal.   Denies any changes in concentration, making decisions, or remembering things.  She's purposely lost about 65# on Zepbound.  No mania, delirium, AH/VH.  No SI/HI.  Individual Medical History/ Review of Systems: Changes? :No   Past medications for mental health diagnoses include: Zoloft, Prozac, Lexapro, Wellbutrin , Viibryd , Cymbalta, Ativan , Abilify   Allergies: Morphine  Current Medications:  Current Outpatient Medications:    ARIPiprazole  (ABILIFY ) 5 MG tablet, Take 1 tablet (5 mg total) by mouth daily., Disp: 90 tablet, Rfl: 1   buPROPion  (WELLBUTRIN  XL) 300 MG 24 hr tablet, TAKE 1 TABLET BY MOUTH EVERY DAY, Disp: 90 tablet, Rfl: 1   magnesium 30 MG tablet, Take 30 mg by mouth 2 (two) times daily., Disp: , Rfl:    naproxen sodium (ALEVE) 220 MG tablet, Take 440 mg by mouth 2 (two) times daily as needed (pain.)., Disp: , Rfl:    SYNTHROID 150 MCG tablet, Take 150 mcg by mouth daily before breakfast. (Patient taking differently: Take 175 mcg by mouth daily before breakfast.), Disp: , Rfl:    tirzepatide (ZEPBOUND) 5 MG/0.5ML injection vial, Inject into the skin., Disp: , Rfl:    Vilazodone  HCl (VIIBRYD ) 40 MG TABS, TAKE 1 TABLET BY MOUTH EVERY DAY, Disp: 90 tablet, Rfl: 1   estrogen, conjugated,-medroxyprogesterone (PREMPRO) 0.3-1.5 MG  tablet, Take 1 tablet by mouth in the morning. (Patient not taking: Reported on 08/08/2023), Disp: , Rfl:    LORazepam  (ATIVAN ) 1 MG tablet, Take 1 tablet (1 mg total) by mouth every 6 (six) hours as needed. for anxiety, Disp: 120 tablet, Rfl: 5   Melatonin 10 MG TABS, Take 10 mg by mouth at bedtime. (Patient not taking: Reported on 02/12/2024), Disp: , Rfl:    NON FORMULARY, Take 2 capsules by mouth daily at 12 noon. Protandim dietary supplement (Patient not taking: Reported on 08/08/2023), Disp: , Rfl:    OVER THE COUNTER MEDICATION, Take 0.5 fluid ounces by mouth daily. Electrolyze (Electrolyte Imbalance Solution) (Patient not taking: Reported on 08/08/2023), Disp: , Rfl:    oxybutynin  (DITROPAN ) 5 MG tablet, Take 1 tablet (5 mg total) by mouth every 8 (eight) hours as needed for bladder spasms. (Patient not taking: Reported on 08/08/2023), Disp: 30 tablet, Rfl: 1   oxyCODONE -acetaminophen  (PERCOCET) 5-325 MG tablet, Take 1 tablet by mouth every 4 (four) hours as needed for severe pain. (Patient not taking: Reported on 08/08/2023), Disp: 12 tablet, Rfl: 0 Medication Side Effects: none  Family Medical/ Social History: Changes? No  MENTAL HEALTH EXAM:  Last menstrual period 02/17/2013.There is no height or weight on file to calculate BMI.  General Appearance: Casual and Well Groomed  Eye Contact:  Good  Speech:  Clear and Coherent and Normal Rate  Volume:  Normal  Mood:  sad  Affect:  Tearful  Thought Process:  Goal Directed and Descriptions of Associations: Circumstantial  Orientation:  Full (Time, Place, and Person)  Thought Content: Logical   Suicidal Thoughts:  No  Homicidal Thoughts:  No  Memory:  WNL  Judgement:  Good  Insight:  Good  Psychomotor Activity:  Normal  Concentration:  Concentration: Good  Recall:  Good  Fund of Knowledge: Good  Language: Good  Assets:  Desire for Improvement Financial Resources/Insurance Housing Transportation Vocational/Educational  ADL's:  Intact   Cognition: WNL  Prognosis:  Good   PCP follows labs  DIAGNOSES:    ICD-10-CM   1. Generalized anxiety disorder  F41.1 LORazepam  (ATIVAN ) 1 MG tablet    2. Recurrent major depressive disorder, in partial remission  F33.41     3. Insomnia, unspecified type  G47.00      Receiving Psychotherapy: No   RECOMMENDATIONS:  PDMP reviewed.  Ativan  filled 02/07/2024. I provided approximately 20 minutes of face to face time during this encounter, including time spent before and after the visit in records review, medical decision making, counseling pertinent to today's visit, and charting.   We discussed the situational anxiety.  I recommend increasing the Ativan . If we need to add/change meds to help prevent the anxiety, we will.  But it's d/t circumstances now.  Continue Abilify  5 mg daily. Continue Wellbutrin  XL 300 mg, 1 p.o. every morning. (450 mg in the past caused PA.) Increase Ativan  1 mg, to 1 p.o. qid prn. Continue Viibryd  40 mg, 1 p.o. daily. Return in 6 months.  Verneita Cooks, PA-C

## 2024-03-21 ENCOUNTER — Other Ambulatory Visit: Payer: Self-pay | Admitting: Physician Assistant

## 2024-04-12 ENCOUNTER — Other Ambulatory Visit: Payer: Self-pay | Admitting: Physician Assistant

## 2024-08-12 ENCOUNTER — Ambulatory Visit: Admitting: Physician Assistant
